# Patient Record
Sex: Male | Born: 1990 | Race: White | Hispanic: No | Marital: Single | Smoking: Never smoker
Health system: Southern US, Community
[De-identification: ages and names within clinical notes are randomized; demographics above are authoritative.]

## PROBLEM LIST (undated history)

## (undated) DIAGNOSIS — S72009A Fracture of unspecified part of neck of unspecified femur, initial encounter for closed fracture: Secondary | ICD-10-CM

## (undated) DIAGNOSIS — G8220 Paraplegia, unspecified: Secondary | ICD-10-CM

## (undated) DIAGNOSIS — S34139A Unspecified injury to sacral spinal cord, initial encounter: Secondary | ICD-10-CM

## (undated) DIAGNOSIS — Q780 Osteogenesis imperfecta: Secondary | ICD-10-CM

## (undated) DIAGNOSIS — M419 Scoliosis, unspecified: Secondary | ICD-10-CM

## (undated) DIAGNOSIS — Z8669 Personal history of other diseases of the nervous system and sense organs: Secondary | ICD-10-CM

## (undated) DIAGNOSIS — S329XXA Fracture of unspecified parts of lumbosacral spine and pelvis, initial encounter for closed fracture: Secondary | ICD-10-CM

## (undated) DIAGNOSIS — Q6601 Congenital talipes equinovarus, right foot: Secondary | ICD-10-CM

## (undated) DIAGNOSIS — G629 Polyneuropathy, unspecified: Secondary | ICD-10-CM

## (undated) HISTORY — PX: APPENDECTOMY: SHX54

## (undated) HISTORY — DX: Osteogenesis imperfecta: Q78.0

## (undated) HISTORY — DX: Unspecified injury to sacral spinal cord, initial encounter: S34.139A

## (undated) HISTORY — DX: Paraplegia, unspecified: G82.20

## (undated) HISTORY — PX: JOINT REPLACEMENT: SHX530

## (undated) HISTORY — PX: OTHER SURGICAL HISTORY: SHX169

## (undated) HISTORY — DX: Fracture of unspecified part of neck of unspecified femur, initial encounter for closed fracture: S72.009A

## (undated) HISTORY — PX: FOREARM FASCIOTOMY: SHX1674

## (undated) HISTORY — DX: Polyneuropathy, unspecified: G62.9

## (undated) HISTORY — DX: Scoliosis, unspecified: M41.9

## (undated) HISTORY — DX: Congenital talipes equinovarus, right foot: Q66.01

## (undated) HISTORY — DX: Fracture of unspecified parts of lumbosacral spine and pelvis, initial encounter for closed fracture: S32.9XXA

## (undated) HISTORY — DX: Personal history of other diseases of the nervous system and sense organs: Z86.69

---
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
## 2009-04-28 ENCOUNTER — Emergency Department: Payer: Self-pay | Admitting: Emergency Medicine

## 2009-04-30 ENCOUNTER — Emergency Department (HOSPITAL_COMMUNITY): Admission: EM | Admit: 2009-04-30 | Discharge: 2009-04-30 | Payer: Self-pay | Admitting: Emergency Medicine

## 2009-05-01 ENCOUNTER — Emergency Department (HOSPITAL_COMMUNITY): Admission: EM | Admit: 2009-05-01 | Discharge: 2009-05-01 | Payer: Self-pay | Admitting: Emergency Medicine

## 2009-11-14 ENCOUNTER — Emergency Department: Payer: Self-pay | Admitting: Emergency Medicine

## 2011-04-14 DIAGNOSIS — Z96641 Presence of right artificial hip joint: Secondary | ICD-10-CM | POA: Insufficient documentation

## 2011-07-29 DIAGNOSIS — S63013A Subluxation of distal radioulnar joint of unspecified wrist, initial encounter: Secondary | ICD-10-CM | POA: Insufficient documentation

## 2011-11-10 DIAGNOSIS — S3282XA Multiple fractures of pelvis without disruption of pelvic ring, initial encounter for closed fracture: Secondary | ICD-10-CM

## 2011-11-10 DIAGNOSIS — S72009A Fracture of unspecified part of neck of unspecified femur, initial encounter for closed fracture: Secondary | ICD-10-CM

## 2011-11-10 HISTORY — DX: Fracture of unspecified part of neck of unspecified femur, initial encounter for closed fracture: S72.009A

## 2011-11-10 HISTORY — DX: Multiple fractures of pelvis without disruption of pelvic ring, initial encounter for closed fracture: S32.82XA

## 2011-11-26 HISTORY — PX: EXCISION RADIAL HEAD: SHX6611

## 2011-11-28 DIAGNOSIS — S3282XA Multiple fractures of pelvis without disruption of pelvic ring, initial encounter for closed fracture: Secondary | ICD-10-CM | POA: Insufficient documentation

## 2011-12-21 DIAGNOSIS — S32599A Other specified fracture of unspecified pubis, initial encounter for closed fracture: Secondary | ICD-10-CM | POA: Insufficient documentation

## 2013-04-21 DIAGNOSIS — Q5310 Unspecified undescended testicle, unilateral: Secondary | ICD-10-CM | POA: Insufficient documentation

## 2013-04-21 DIAGNOSIS — N4889 Other specified disorders of penis: Secondary | ICD-10-CM | POA: Insufficient documentation

## 2013-04-21 DIAGNOSIS — N529 Male erectile dysfunction, unspecified: Secondary | ICD-10-CM | POA: Insufficient documentation

## 2013-08-01 DIAGNOSIS — N201 Calculus of ureter: Secondary | ICD-10-CM | POA: Insufficient documentation

## 2013-11-20 DIAGNOSIS — Z79899 Other long term (current) drug therapy: Secondary | ICD-10-CM | POA: Insufficient documentation

## 2013-11-20 DIAGNOSIS — M545 Low back pain, unspecified: Secondary | ICD-10-CM | POA: Insufficient documentation

## 2013-11-20 DIAGNOSIS — M79602 Pain in left arm: Secondary | ICD-10-CM | POA: Insufficient documentation

## 2013-11-20 DIAGNOSIS — M546 Pain in thoracic spine: Secondary | ICD-10-CM | POA: Insufficient documentation

## 2013-11-20 DIAGNOSIS — M25551 Pain in right hip: Secondary | ICD-10-CM | POA: Insufficient documentation

## 2013-11-20 DIAGNOSIS — M25552 Pain in left hip: Secondary | ICD-10-CM

## 2013-11-20 DIAGNOSIS — T819XXA Unspecified complication of procedure, initial encounter: Secondary | ICD-10-CM | POA: Insufficient documentation

## 2013-11-20 DIAGNOSIS — M961 Postlaminectomy syndrome, not elsewhere classified: Secondary | ICD-10-CM | POA: Insufficient documentation

## 2014-03-01 DIAGNOSIS — M4106 Infantile idiopathic scoliosis, lumbar region: Secondary | ICD-10-CM | POA: Insufficient documentation

## 2014-03-01 DIAGNOSIS — K529 Noninfective gastroenteritis and colitis, unspecified: Secondary | ICD-10-CM | POA: Insufficient documentation

## 2014-03-12 DIAGNOSIS — M87051 Idiopathic aseptic necrosis of right femur: Secondary | ICD-10-CM | POA: Insufficient documentation

## 2014-04-26 DIAGNOSIS — R310 Gross hematuria: Secondary | ICD-10-CM | POA: Insufficient documentation

## 2014-05-29 DIAGNOSIS — Z96641 Presence of right artificial hip joint: Secondary | ICD-10-CM | POA: Insufficient documentation

## 2014-05-29 DIAGNOSIS — M419 Scoliosis, unspecified: Secondary | ICD-10-CM | POA: Insufficient documentation

## 2014-05-29 DIAGNOSIS — Z981 Arthrodesis status: Secondary | ICD-10-CM | POA: Insufficient documentation

## 2014-05-29 DIAGNOSIS — W1789XA Other fall from one level to another, initial encounter: Secondary | ICD-10-CM | POA: Insufficient documentation

## 2014-05-30 DIAGNOSIS — G8220 Paraplegia, unspecified: Secondary | ICD-10-CM

## 2014-05-30 HISTORY — DX: Paraplegia, unspecified: G82.20

## 2014-06-25 DIAGNOSIS — S34139A Unspecified injury to sacral spinal cord, initial encounter: Secondary | ICD-10-CM | POA: Insufficient documentation

## 2014-06-25 HISTORY — DX: Unspecified injury to sacral spinal cord, initial encounter: S34.139A

## 2014-07-24 ENCOUNTER — Ambulatory Visit: Payer: Medicare Other | Attending: Surgical | Admitting: Physical Therapy

## 2014-07-24 DIAGNOSIS — M545 Low back pain, unspecified: Secondary | ICD-10-CM

## 2014-07-24 DIAGNOSIS — X58XXXD Exposure to other specified factors, subsequent encounter: Secondary | ICD-10-CM | POA: Insufficient documentation

## 2014-07-24 DIAGNOSIS — S298XXD Other specified injuries of thorax, subsequent encounter: Secondary | ICD-10-CM | POA: Diagnosis present

## 2014-07-24 DIAGNOSIS — S24104A Unspecified injury at T11-T12 level of thoracic spinal cord, initial encounter: Secondary | ICD-10-CM

## 2014-07-24 NOTE — Therapy (Signed)
[facility] MAIN [facility] SERVICES [address] [place], [place], [address] Phone: [phone]   Fax:  [phone]  Physical Therapy Evaluation  Patient Details  Name: Christian Harmon MRN: [id number] Date of Birth: 26-Feb-1990 Referring Provider:  No ref. provider found  Encounter Date: 07/24/2014      PT End of Session - 07/24/14 1150    Visit Number 1   Number of Visits 9   Date for PT Re-Evaluation 09/04/14   Authorization Type 1/10   PT Start Time 0830   PT Stop Time 0902   PT Time Calculation (min) 32 min   Activity Tolerance Patient tolerated treatment well   Behavior During Therapy Agitated;WFL for tasks assessed/performed      No past medical history on file.  No past surgical history on file.  There were no vitals filed for this visit.  Visit Diagnosis:  Injury at T11-T12 level of thoracic spinal cord, initial encounter  Midline low back pain without sciatica      Subjective Assessment - 07/24/14 0829    Subjective Patient reports he fell and had a T12 injury, and has been paralyzed from the waist down. Patient went to inpatient rehab for roughly 10 days and has been home for roughly one week since then. Patient has a stander at home, and wants to come in for stretching his legs and to decrease his back pain.    Patient is accompained by: Family member   Currently in Pain? Yes   Pain Score 8    Pain Location Leg   Pain Orientation Left;Right   Multiple Pain Sites Yes   Pain Score 8   Pain Location Back   Pain Orientation Lower   Pain Type Chronic pain   Pain Onset More than a month ago   Aggravating Factors  Prolonged sitting, though it waxes and wanes throughout the day.    Effect of Pain on Daily Activities Decreases his tolerance for UE exercise             [facility] PT Assessment - 07/24/14 0900    Assessment   Medical Diagnosis Spinal cord injury T 12   Onset Date/Surgical Date 05/24/14   Prior Therapy --   Inpatient rehab at [facility]    Precautions   Precautions Fall   Restrictions   Weight Bearing Restrictions No   Balance Screen   Has the patient fallen in the past 6 months Yes   How many times? 1   Has the patient had a decrease in activity level because of a fear of falling?  Yes   Is the patient reluctant to leave their home because of a fear of falling?  No   Prior Function   Level of Independence Independent with community mobility with device  Uses wheelchair   Cognition   Overall Cognitive Status Within Functional Limits for tasks assessed   Sensation   Light Touch Impaired Detail   Light Touch Impaired Details Absent LLE;Absent RLE   Posture/Postural Control   Posture/Postural Control Postural limitations   Postural Limitations Forward head;Flexed trunk   Tone   Assessment Location Right Lower Extremity;Left Lower Extremity   Flexibility   Hamstrings --  90-90 test provoked his symptoms   Piriformis Provoked symptoms   Bed Mobility   Right Sidelying to Sit 7: Independent   Supine to Sit 7: Independent   Transfers   Transfers Lateral/Scoot Transfers   Lateral/Scoot Transfers 6: Modified independent (Device/Increase time)  Uses wheelchair   Ambulation/Gait   Ambulation/Gait No   Control and instrumentation engineer Assistance 6: Modified independent (Device/Increase time)   Occupational hygienist Both upper extremities   Wheelchair Parts Management Independent   Distance --  Pharmacologist Sitting - Balance Support Feet unsupported   Static Sitting - Level of Assistance 7: Independent   Static Sitting - Comment/# of Minutes 3   Dynamic Sitting Balance   Dynamic Sitting - Balance Support Feet unsupported   Dynamic Sitting - Level of Assistance 7: Independent   Dynamic Sitting - Balance Activities Trunk control activities   Dynamic Sitting balance - Comments Mild strength deficits with anterior/posterior and lateral  perturbations   RLE Tone   RLE Tone Flaccid   LLE Tone   LLE Tone Flaccid     HEP Provided -- Patient's mother was educated hands on in stretching program  90-90 hamstrings stretch (initially painful, but able to be completed without pain at lower ROM of knee extension) SLR's with PROM, initially painful at higher levels of ROM (in his lower back). Piriformis stretching again initially painful, though this was able to be reduced by decreasing the ROM used.  X 30 seconds for 2 repetitions bilaterally for each of these.  2+ reflex noted in patellar tendon on RLE Patient noted to have spasms in LEs at various points during the session.                       PT Education - 07/24/14 1149    Education provided Yes   Education Details Patient educated on PT plan of care and home exercise program/stretching program.    Person(s) Educated Patient;Parent(s)   Methods Explanation;Demonstration;Handout  Had his mother complete stretches with him   Comprehension Verbalized understanding;Returned demonstration;Verbal cues required             PT Long Term Goals - 07/24/14 1327    PT LONG TERM GOAL #1   Title Patient will be independent with HEP to increase UE and trunkal strength to decrease pain and increase tolerance for wheelchair mobility by7/26/2016.   Status New   PT LONG TERM GOAL #2   Title Patient will sit independently on mat table for at least 5 minutes with no UE or LE support to complete ADLs by 09/04/2014   Status New   PT LONG TERM GOAL #3   Title Patient will report a worst VAS score of 4/10 in his lower back to allow for increased mobility tolerance by 09/04/2014.   Baseline 8/10 pain    Status New   PT LONG TERM GOAL #4   Title Patient will not express pain symptoms in lower back with LE stretching program indicating improved tissue length and mobility to allow for increased mobility by 09/04/2014   Status New               Plan - 07/24/14 1248     Clinical Impression Statement Patient is a 24 y/o male that presents with recent onset SCI at the T12 level. Patient was late to appointment, and thus a complete examination was not possible. He currently has no sensation or motor strength below his waist. He is currently independent with transfers and wheelchair mobility. He displays mild sitting balance deficits and low back pain that is limiting his mobility and participation in therapeutic exercises. Patient was discharged from inpatient rehab roughly 1 week prior to this, and had  not made LE mobility progress as was hoped. Patient would benefit from outpatient PT services to address his trunkal weakness, low back pain, and mild UE strength defiicts via development of a HEP.    Pt will benefit from skilled therapeutic intervention in order to improve on the following deficits Cardiopulmonary status limiting activity;Decreased strength;Pain;Decreased balance   Rehab Potential Good   Clinical Impairments Affecting Rehab Potential Has had a recent SCI, is young and fairly motivated.    PT Frequency 2x / week   PT Duration 4 weeks   PT Treatment/Interventions ADLs/Self Care Home Management;Wheelchair mobility training;Balance training;Therapeutic exercise;Neuromuscular re-education;Therapeutic activities;Manual techniques   PT Next Visit Plan Progress HEP as tolerated and include seated balance/trunkal strengthening activities    PT Home Exercise Plan See patient instructions    Consulted and Agree with Plan of Care Family member/caregiver;Patient   Family Member Consulted Mother           G-Codes - 08/13/2014 1152    Functional Limitation Changing and maintaining body position   Changing and Maintaining Body Position Current Status [phone]) At least 1 percent but less than 20 percent impaired, limited or restricted   Changing and Maintaining Body Position Goal Status (U0454) 0 percent impaired, limited or restricted       Problem  List There are no active problems to display for this patient.  Kerin Ransom, PT, DPT   Aug 13, 2014, 1:34 PM  [facility] MAIN [facility] SERVICES [address] [place], [place], [address] Phone: [phone]   Fax:  [phone]

## 2014-07-24 NOTE — Patient Instructions (Signed)
All exercises provided were adapted from [email or web address]. Patient was provided a written handout with pictures as described. Any additional cues were manually entered in to handout and copied in to this document.    Bicep Curls  Sit with elbows tucked at your sides. Bend your elbow up, bringing the weight up to your shoulder. Slowly bend your elbow back down to your side.     PIRIFORMIS STRETCH - MODIFIED  While lying on your back, hold your knee with your opposite hand and draw your knee up and over towards your opposite shoulder.    Hamstring Stretch with Fascial Glides  Lying on your back and keeping both legs straight, raise leg with strap until you feel a stretch. Next, pump your ankle up and down 20 times, feeling the stretch increase as your toes come towards your head. Lower leg back to table and repeat three times.   Wheelchair Tricep [employer] wheelchair. Perform pressure relief with both hands or forearm using armrest.  Hold position for required frequency and duration. For alternate hand placement, push up from wheels.    FREE WEIGHT - CHEST PRESS  Lie on your back with your elbows bent. Next, slowly raise up your arms towards the ceiling while extending your elbows straight up above your head.    Hamstring Stretch 90-90, Supine  Position the patient lying on his/her back with hip and knee bent. Slowly attempt to straighten the knee while the hip remains in the same position.

## 2014-07-26 ENCOUNTER — Encounter: Payer: Self-pay | Admitting: Physical Therapy

## 2014-07-26 ENCOUNTER — Ambulatory Visit: Payer: Medicare Other | Admitting: Physical Therapy

## 2014-07-26 DIAGNOSIS — S24104A Unspecified injury at T11-T12 level of thoracic spinal cord, initial encounter: Secondary | ICD-10-CM

## 2014-07-26 DIAGNOSIS — M545 Low back pain, unspecified: Secondary | ICD-10-CM

## 2014-07-26 DIAGNOSIS — S298XXD Other specified injuries of thorax, subsequent encounter: Secondary | ICD-10-CM | POA: Diagnosis not present

## 2014-07-26 NOTE — Therapy (Signed)
[facility] MAIN [facility] SERVICES [address] [place], [place], [address] Phone: [phone]   Fax:  [phone]  Physical Therapy Treatment  Patient Details  Name: Christian Harmon MRN: [id number] Date of Birth: 1990-05-13 Referring Provider:  No ref. provider found  Encounter Date: 07/26/2014      PT End of Session - 07/26/14 0852    Visit Number 2   Number of Visits 9   Date for PT Re-Evaluation 09/04/14   Authorization Type 2/10   PT Start Time 0804   PT Stop Time 0844   PT Time Calculation (min) 40 min   Activity Tolerance Patient tolerated treatment well   Behavior During Therapy Agitated;WFL for tasks assessed/performed      No past medical history on file.  No past surgical history on file.  There were no vitals filed for this visit.  Visit Diagnosis:  Injury at T11-T12 level of thoracic spinal cord, initial encounter  Midline low back pain without sciatica      Subjective Assessment - 07/26/14 0805    Subjective Patient reports he felt the stretches were effectivre in loosening up his legs, but increased his back pain dramatically.    Patient is accompained by: Family member   Pertinent History Patient had a spinal cord injury at T12 roughly later March, early April of this past year.    Limitations Sitting   Patient Stated Goals To increase his UE strength and stretch out his legs.    Currently in Pain? Yes   Pain Score 8    Pain Location Back   Pain Orientation Lower   Pain Descriptors / Indicators Aching   Pain Type Chronic pain   Pain Onset More than a month ago   Pain Frequency Intermittent   Aggravating Factors  Stretching, prolonged sitting.    Effect of Pain on Daily Activities Makes it difficult for him to complete wheelchair mobility and therapeutic activities.        Manual therapy  Soft tissue mobilization provided to the lumbar paraspinals and bilateral posterior flank musculature with patient  sitting to relieve tightness and pain. Patient found significant relief.   There-Ex  Push-pull drill with PVC pipe in sitting x 6 repetitions with 5 second holds for 2 sets to increase core strength Bilateral shoulder ER exercise with yellow theraband x 10 for 2 sets with cuing for elbows being maintained at his sides.  Middle rows with yellow theraband x 10 repetitions for 2 sets with cuing for maintaining wrists and elbows at the same height.  Scapular retractions x 10 for 2 sets in sitting to promote more vertical trunk posture  Educated patient to use a towel roll or pillow to provide lumbar support.                           PT Education - 07/26/14 0850    Education provided Yes   Education Details Patient educated patient on upright posture and it's impact on low back pain and shoulder ROM/impingement. Patient educated to decrease his LE stretching by half and to have his parents perform soft tissue mobilizaiton to his low back.    Person(s) Educated Patient;Parent(s)   Methods Demonstration;Explanation;Handout   Comprehension Verbalized understanding;Returned demonstration;Verbal cues required             PT Long Term Goals - 07/24/14 1327    PT LONG TERM GOAL #1   Title Patient will be independent  with HEP to increase UE and trunkal strength to decrease pain and increase tolerance for wheelchair mobility by7/26/2016.   Status New   PT LONG TERM GOAL #2   Title Patient will sit independently on mat table for at least 5 minutes with no UE or LE support to complete ADLs by 09/04/2014   Status New   PT LONG TERM GOAL #3   Title Patient will report a worst VAS score of 4/10 in his lower back to allow for increased mobility tolerance by 09/04/2014.   Baseline 8/10 pain    Status New   PT LONG TERM GOAL #4   Title Patient will not express pain symptoms in lower back with LE stretching program indicating improved tissue length and mobility to allow for  increased mobility by 09/04/2014   Status New               Plan - 07/26/14 0852    Clinical Impression Statement Patient initially presents with increased low back pain, which he attributes to stretching program provided at eval. Patient responded quite well to trunk strengthening and soft tissue mobilization provided today and stated he felt an improvement in low back symptoms after the session. Patient provided HEP including core and rotator cuff strengthening activities to reduce likelihood of shoulder injury and reduce low back pain symtpoms. Skilled acute PT services are indicated to address his low back pain, mobility, and reduce likelihood of shoulder injury.   Pt will benefit from skilled therapeutic intervention in order to improve on the following deficits Cardiopulmonary status limiting activity;Decreased strength;Pain;Decreased balance;Decreased endurance;Improper body mechanics   Rehab Potential Good   Clinical Impairments Affecting Rehab Potential History of spinal cord injury at T12 level.    PT Frequency 2x / week   PT Duration 4 weeks   PT Treatment/Interventions ADLs/Self Care Home Management;Wheelchair mobility training;Balance training;Therapeutic exercise;Neuromuscular re-education;Therapeutic activities;Manual techniques   PT Next Visit Plan Progress periscapular strengthening, core strengthening and manual techniques as indicated by pain.    PT Home Exercise Plan See patient instructions    Consulted and Agree with Plan of Care Family member/caregiver;Patient   Family Member Consulted Father         Problem List There are no active problems to display for this patient.  Kerin Ransom, PT, DPT   07/26/2014, 12:03 PM  [facility] MAIN [facility] SERVICES [address] [place], [place], [address] Phone: [phone]   Fax:  [phone]

## 2014-07-26 NOTE — Patient Instructions (Signed)
All exercises provided were adapted from [email or web address]. Patient was provided a written handout with pictures as described. Any additional cues were manually entered in to handout and copied in to this document.    Bilateral Shoulder External Rotation (10 times, 3 second hold, 1 set, 1 time per day)  Standing or sitting with upright posture, hold the middle of a piece of theraband with both hands. With your elbows at your side and bent at 90 degree angle, pull your hands outward and hold for 3 seconds. You should feel you shoulders roll backward and your chest move forward.    ** Make sure to pinch your shoulder blades together throughout.    Retraction (10 times, 3 second hold, 2 sets, 1 time per day)  Push arms straight forward and pull back to squeeze shoulder blades together.     Use a PVC pipe or broom handle to push and pull against resistance   SCAPULAR RETRACTIONS (10 times, 3 second hold, 2 sets, 2 time per day)  Draw your shoulder blades back and down.    3 WAY SCAPULAR ROW - MIDDLE (10 times, 3 second hold, 2 sets, 1 time per day)  Tie a knot in the elastic band and close the knot in the door jam. Perform this exercise with the elastic band at elbow level.  Hold an elastic band with both arms in front of you. Next, pull the band back towards your side as you bend your elbows.

## 2014-07-31 ENCOUNTER — Ambulatory Visit: Payer: Medicare Other | Admitting: Physical Therapy

## 2014-07-31 DIAGNOSIS — S24104A Unspecified injury at T11-T12 level of thoracic spinal cord, initial encounter: Secondary | ICD-10-CM

## 2014-07-31 DIAGNOSIS — S298XXD Other specified injuries of thorax, subsequent encounter: Secondary | ICD-10-CM | POA: Diagnosis not present

## 2014-07-31 DIAGNOSIS — M545 Low back pain, unspecified: Secondary | ICD-10-CM

## 2014-07-31 NOTE — Therapy (Signed)
[place] MAIN [facility] SERVICES [address] [place], [place], [address] Phone: [phone]   Fax:  [phone]  Physical Therapy Treatment  Patient Details  Name: Christian Harmon MRN: [id number] Date of Birth: 1990-12-10 Referring Provider:  No ref. provider found  Encounter Date: 07/31/2014      PT End of Session - 07/31/14 1047    Visit Number 3   Number of Visits 9   Date for PT Re-Evaluation 09/04/14   Authorization Type 3/10   PT Start Time 0901   PT Stop Time 0944   PT Time Calculation (min) 43 min   Activity Tolerance Patient tolerated treatment well   Behavior During Therapy Agitated;WFL for tasks assessed/performed      No past medical history on file.  No past surgical history on file.  There were no vitals filed for this visit.  Visit Diagnosis:  Injury at T11-T12 level of thoracic spinal cord, initial encounter  Midline low back pain without sciatica      Subjective Assessment - 07/31/14 0904    Subjective Patient reports that he has started having numbness and tingling in his RUE (middle, ring, and pinky fingers), that is alleviated when his mom "presses on the nerve". The family associates it with poor positioning in the w/c.    Patient is accompained by: Family member   Pertinent History Patient had a spinal cord injury at T12 roughly later March, early April of this past year.    Limitations Sitting   Patient Stated Goals To increase his UE strength and stretch out his legs.    Currently in Pain? Yes   Pain Score 7    Pain Location Back   Pain Orientation Lower   Pain Descriptors / Indicators Aching   Pain Type Chronic pain   Pain Onset More than a month ago   Pain Frequency Intermittent   Aggravating Factors  Prolonged sitting       There-Ex  Negative Vertebral artery bilaterally  Full AROM in all directions of the neck. Mild increase in symptoms with right ipsilateral side bending.  Negative  Spurling's in neutral, extension, rotation, and extension with rotation bilaterally  Attempted self finger extensor/wrist extensor stretching x 1 minute, no relief of symptoms.  Push-pull with PVC pipe x 10 repetitions for 2 sets with no increased LBP Prone push-up to elbows, unable to achieve any relief in LBP secondary to decreased ROM in lumbar spine  Yellow theraband bilateral external rotations x 12 for 2 sets. Proper technique encouraged (elbows at sides).    Manual therapy  Soft tissue mobilization on lumbar paraspinals   ULTT for ulnar nerve repeated exercise x 1 minute for 3 repetitions, reduced numbness and tingling by 50%  ** Patient and father inquired about TENS unit and heat to relieve tightness/pain in lumbar spine.  ** When patient left session he stated very minimal numbness and tingling just in the tips of middle, ring, and pinky finger on RUE and minimal to no pain in lower back.                          PT Education - 07/31/14 1045    Education provided Yes   Education Details Patient educated to flex trunk forward when he feels stiff/painful. Patient educated on upper limb tension test position to relieve numbness/tingling in RUE.    Person(s) Educated Patient;Parent(s)   Methods Explanation;Demonstration;Handout   Comprehension Verbalized understanding;Returned demonstration;Verbal cues  required             PT Long Term Goals - 07/31/14 1052    PT LONG TERM GOAL #1   Title Patient will be independent with HEP to increase UE and trunkal strength to decrease pain and increase tolerance for wheelchair mobility by7/26/2016.   Status Partially Met   PT LONG TERM GOAL #2   Title Patient will sit independently on mat table for at least 5 minutes with no UE or LE support to complete ADLs by 09/04/2014   Status Achieved   PT LONG TERM GOAL #3   Title Patient will report a worst VAS score of 4/10 in his lower back to allow for increased mobility  tolerance by 09/04/2014.   Baseline 8/10 pain    Status On-going   PT LONG TERM GOAL #4   Title Patient will not express pain symptoms in lower back with LE stretching program indicating improved tissue length and mobility to allow for increased mobility by 09/04/2014   Status On-going               Plan - 07/31/14 1047    Clinical Impression Statement Patient initially presents with low back pain and numbness/tingling in his RUE (middle, ring, and pinky fingers). He reports a significant decrease in numbness/tingling with upper limb tension testing (ulnar nerve position) and did not show any cervical involvement via testing. Patient reports resolution of back pain after soft tissue mobilization and forward trunk flexion. Patient would benefit from contuned shoulder girdle and core strengthening to reduce his symptoms and increase his UE strength to minimize risk of rotator cuff pathology.    Pt will benefit from skilled therapeutic intervention in order to improve on the following deficits Cardiopulmonary status limiting activity;Decreased strength;Pain;Decreased balance;Decreased endurance;Improper body mechanics   Rehab Potential Good   Clinical Impairments Affecting Rehab Potential History of spinal cord injury at T12 level.    PT Frequency 2x / week   PT Duration 4 weeks   PT Treatment/Interventions ADLs/Self Care Home Management;Wheelchair mobility training;Balance training;Therapeutic exercise;Neuromuscular re-education;Therapeutic activities;Manual techniques   PT Next Visit Plan Progress self management techniques for LBP (soft tissue mobilization, forward trunk flexion), increase core strengthening and shoulder girdle strengthening as tolerated.    PT Home Exercise Plan See patient instructions    Consulted and Agree with Plan of Care Family member/caregiver;Patient   Family Member Consulted Father         Problem List There are no active problems to display for this  patient.    Kerman Passey, PT, DPT   07/31/2014, 10:54 AM  [place] MAIN [facility] SERVICES [address] [place], [place], [address] Phone: [phone]   Fax:  [phone]

## 2014-07-31 NOTE — Patient Instructions (Addendum)
All exercises provided were adapted from [email or web address]. Patient was provided a written handout with pictures as described. Any additional cues were manually entered in to handout and copied in to this document.    ULNAR NERVE FLOSS - B (10 times, 3 second hold, 1 set, 2 x per day)  Start with your arm up and out to the side with a bend elbow as shown. Your palm should be facing towards the side. Next, bend your wrist towards you as you side bend your head towards the target arm as shown. Then, bend your wrist away from you as you side bend your head away from the target arm.   Your other hand should be checking to make sure that your shoulder stays down and drawn back the entire time.     Anterior/Med Scalene Stretch (10 times, 5 second hold, 1 set, 2 x per day)  Pull your head to the side with one arm while the other arm stays in a relaxed position by your side.  Next look towards the ceiling while you are continuing to pull with one hand.  Finally, apply a chin tuck to the final position for the final stretch.

## 2014-08-02 ENCOUNTER — Ambulatory Visit: Payer: Medicare Other | Admitting: Physical Therapy

## 2014-08-02 DIAGNOSIS — S24104A Unspecified injury at T11-T12 level of thoracic spinal cord, initial encounter: Secondary | ICD-10-CM

## 2014-08-02 DIAGNOSIS — M545 Low back pain, unspecified: Secondary | ICD-10-CM

## 2014-08-02 DIAGNOSIS — S298XXD Other specified injuries of thorax, subsequent encounter: Secondary | ICD-10-CM | POA: Diagnosis not present

## 2014-08-02 NOTE — Therapy (Signed)
[place] MAIN [facility] SERVICES [address] [place], [place], [address] Phone: [phone]   Fax:  [phone]  Physical Therapy Treatment  Patient Details  Name: Terryon Pineiro MRN: [id number] Date of Birth: Nov 18, 1990 Referring Provider:  No ref. provider found  Encounter Date: 08/02/2014      PT End of Session - 08/02/14 0900    Visit Number 4   Number of Visits 9   Date for PT Re-Evaluation 09/04/14   Authorization Type 4/10   PT Start Time 0811   PT Stop Time 0853   PT Time Calculation (min) 42 min   Activity Tolerance Patient tolerated treatment well   Behavior During Therapy Agitated;WFL for tasks assessed/performed      No past medical history on file.  No past surgical history on file.  There were no vitals filed for this visit.  Visit Diagnosis:  Injury at T11-T12 level of thoracic spinal cord, initial encounter  Midline low back pain without sciatica      Subjective Assessment - 08/02/14 0826    Subjective Patient reports he is noticing significant pain relief in his lower back with a change in medications and with the soft tissue mobilizations. Patient continues to have numbness and tingling in his RUE, he found some relief from stretching at home but not like in the clinic.    Pertinent History Patient had a spinal cord injury at T12 roughly later March, early April of this past year.    Limitations Sitting   Patient Stated Goals To increase his UE strength and stretch out his legs.    Pain Score 6    Pain Location Back   Pain Orientation Lower   Pain Descriptors / Indicators Aching   Pain Type Chronic pain   Pain Onset More than a month ago   Pain Frequency Intermittent   Aggravating Factors  Prolonged sitting    Effect of Pain on Daily Activities Makes it difficult to complete w/c exercises and therapeutic activities.    Multiple Pain Sites No       Manual Therapy  Soft tissue mobilization provided to  lower back, relieved tension and tightness in the lower back  ULTT stretching for ulnar nerve x 5 repetitions with 30 second holds, significantly relieved numbness/tingling in fingers  Manual cervical traction x 30 seconds for 3 sets. Relieved vast majority of numbness/tingling in fingers    There-Ex  Scaption with 3# DBs x 10 repetitions for 2 sets  Chest press with scapular punch 7# DB x 10 repetitions for 2 sets                           PT Education - 08/02/14 0859    Education provided Yes   Education Details Patient educated on progression with pain, to continue HEP and soft tissue mobilization, progressed HEP to include pec minor stretching and instructed patient to complete cervical traction with parents.    Person(s) Educated Patient   Methods Explanation;Demonstration;Handout   Comprehension Verbalized understanding;Returned demonstration;Verbal cues required             PT Long Term Goals - 07/31/14 1052    PT LONG TERM GOAL #1   Title Patient will be independent with HEP to increase UE and trunkal strength to decrease pain and increase tolerance for wheelchair mobility by7/26/2016.   Status Partially Met   PT LONG TERM GOAL #2   Title Patient will sit independently  on mat table for at least 5 minutes with no UE or LE support to complete ADLs by 09/04/2014   Status Achieved   PT LONG TERM GOAL #3   Title Patient will report a worst VAS score of 4/10 in his lower back to allow for increased mobility tolerance by 09/04/2014.   Baseline 8/10 pain    Status On-going   PT LONG TERM GOAL #4   Title Patient will not express pain symptoms in lower back with LE stretching program indicating improved tissue length and mobility to allow for increased mobility by 09/04/2014   Status On-going               Plan - 08/02/14 0902    Clinical Impression Statement Patient continues to make progress with lower back pain and numbness/tingling in his RUE with  soft tissue mobilization and manual stretching. Patient is making progress with UE strengthening program, but will need to be re-assessed in next session to ensure that numbness/tingling is not increased with strengthening program. Skilled PT services are indicated to reduce his lower back pain and increase his UE strengthening to help reduce his risk for RTC injuries.    Pt will benefit from skilled therapeutic intervention in order to improve on the following deficits Cardiopulmonary status limiting activity;Decreased strength;Pain;Decreased balance;Decreased endurance;Improper body mechanics   Rehab Potential Good   Clinical Impairments Affecting Rehab Potential History of spinal cord injury at T12 level.    PT Frequency 2x / week   PT Duration 4 weeks   PT Treatment/Interventions ADLs/Self Care Home Management;Wheelchair mobility training;Balance training;Therapeutic exercise;Neuromuscular re-education;Therapeutic activities;Manual techniques   PT Next Visit Plan Progress shoulder girdle strengthening and increase core strengthening as tolerated. Provide manual techniques (soft tissue mobilization, cervical traction, ULTT stretching to relieve pain symptoms).   PT Home Exercise Plan See patient instructions    Consulted and Agree with Plan of Care Patient        Problem List There are no active problems to display for this patient.   Kerman Passey, PT, DPT   08/02/2014, 9:19 AM  [place] MAIN [facility] SERVICES [address] [place], [place], [address] Phone: [phone]   Fax:  [phone]

## 2014-08-02 NOTE — Patient Instructions (Addendum)
All exercises provided were adapted from [email or web address]. Patient was provided a written handout with pictures as described. Any additional cues were manually entered in to handout and copied in to this document.   FREE WEIGHT SCAPTION (10 times, 2 sets, 1x per day)  Slowly raise up your arm away from your side in a forward/lateral direction. Your elbows should be straight and movement to occur in the plane of the scapula or 45 degrees to the side.    ** You used 3#DB for this    FREE WEIGHT - CHEST PRESS (10 times, 2 sets, 1x per day)  Lie on your back with your elbows bent. Next, slowly raise up your arms towards the ceiling while extending your elbows straight up above your head.    ** You used 7# DB for this    Pectoralis Minor Stretch (8 times, 5 second hold, 1 set, 2x per day)  Standing, place shoulder and arm against wall. Step same leg forward. Lean and turn in opposite direction from wall.

## 2014-08-07 ENCOUNTER — Ambulatory Visit: Payer: Medicare Other | Admitting: Physical Therapy

## 2014-08-07 ENCOUNTER — Encounter: Payer: Self-pay | Admitting: Physical Therapy

## 2014-08-07 DIAGNOSIS — S24104A Unspecified injury at T11-T12 level of thoracic spinal cord, initial encounter: Secondary | ICD-10-CM

## 2014-08-07 DIAGNOSIS — M545 Low back pain, unspecified: Secondary | ICD-10-CM

## 2014-08-07 DIAGNOSIS — S298XXD Other specified injuries of thorax, subsequent encounter: Secondary | ICD-10-CM | POA: Diagnosis not present

## 2014-08-07 NOTE — Therapy (Signed)
[place] MAIN [facility] SERVICES [address] [place], [place], [address] Phone: [phone]   Fax:  [phone]  Physical Therapy Treatment  Patient Details  Name: Christian Harmon MRN: [id number] Date of Birth: 06-19-1990 Referring Provider:  No ref. provider found  Encounter Date: 08/07/2014      PT End of Session - 08/07/14 0904    Visit Number 5   Number of Visits 10   Date for PT Re-Evaluation 09/04/14   Authorization Type 5/10   PT Start Time 0835   PT Stop Time 0915   PT Time Calculation (min) 40 min   Activity Tolerance Patient tolerated treatment well   Behavior During Therapy Agitated;WFL for tasks assessed/performed      History reviewed. No pertinent past medical history.  History reviewed. No pertinent past surgical history.  There were no vitals filed for this visit.  Visit Diagnosis:  Injury at T11-T12 level of thoracic spinal cord, initial encounter  Midline low back pain without sciatica      Subjective Assessment - 08/07/14 0845    Subjective (p) Patient says that the tingling and numbness goes away temporally but then it comes back. He has tingling and numbness that started  He reports that he is having back pain today 9/10.          Manual Therapy  Soft tissue mobilization provided to lower back, relieved tension and tightness in the lower back  ULTT stretching for ulnar nerve x 5 repetitions with 30 second holds, significantly relieved numbness/tingling in fingers  E-stim crossed pattern to Right paraspinal muscles x 20 mintues Patient has decreased pain from 9/10 to 7/10 following manual therapy and e-stim.                         PT Education - 08/07/14 [phone]    Education provided Yes   Education Details HEP with self tissue massage.   Person(s) Educated Patient   Methods Explanation;Demonstration   Comprehension Verbalized understanding;Returned demonstration              PT Long Term Goals - 07/31/14 1052    PT LONG TERM GOAL #1   Title Patient will be independent with HEP to increase UE and trunkal strength to decrease pain and increase tolerance for wheelchair mobility by7/26/2016.   Status Partially Met   PT LONG TERM GOAL #2   Title Patient will sit independently on mat table for at least 5 minutes with no UE or LE support to complete ADLs by 09/04/2014   Status Achieved   PT LONG TERM GOAL #3   Title Patient will report a worst VAS score of 4/10 in his lower back to allow for increased mobility tolerance by 09/04/2014.   Baseline 8/10 pain    Status On-going   PT LONG TERM GOAL #4   Title Patient will not express pain symptoms in lower back with LE stretching program indicating improved tissue length and mobility to allow for increased mobility by 09/04/2014   Status On-going               Plan - 08/07/14 0905    Clinical Impression Statement Pateint continues to have mid back pain 9/10 and numbness and tingling in RUE 3rd, 4th and 5 th digit. Patient will continue to benefit from skilled PT to improve pain and mobility.    Pt will benefit from skilled therapeutic intervention in order to improve on the following  deficits Cardiopulmonary status limiting activity;Decreased strength;Pain;Decreased balance;Decreased endurance;Improper body mechanics   PT Frequency 2x / week   PT Duration 4 weeks   PT Treatment/Interventions Cryotherapy;Electrical Stimulation;Moist Heat;Ultrasound;Therapeutic activities;Therapeutic exercise;Dry needling;Manual techniques   PT Next Visit Plan Progress shoulder girdle strengthening and increase core strengthening as tolerated. Provide manual techniques (soft tissue mobilization, cervical traction, ULTT stretching to relieve pain symptoms).   Consulted and Agree with Plan of Care Patient        Problem List There are no active problems to display for this patient.   Alanson Puls 08/07/2014, 9:23  AM  [place] MAIN [facility] SERVICES [address] [place], [place], [address] Phone: [phone]   Fax:  [phone]

## 2014-08-09 ENCOUNTER — Ambulatory Visit: Payer: Medicare Other | Admitting: Physical Therapy

## 2014-08-09 ENCOUNTER — Encounter: Payer: Self-pay | Admitting: Physical Therapy

## 2014-08-09 DIAGNOSIS — M545 Low back pain, unspecified: Secondary | ICD-10-CM

## 2014-08-09 DIAGNOSIS — S24104A Unspecified injury at T11-T12 level of thoracic spinal cord, initial encounter: Secondary | ICD-10-CM

## 2014-08-09 DIAGNOSIS — S298XXD Other specified injuries of thorax, subsequent encounter: Secondary | ICD-10-CM | POA: Diagnosis not present

## 2014-08-09 NOTE — Therapy (Signed)
[place] MAIN [facility] SERVICES [address] [place], [place], [address] Phone: [phone]   Fax:  [phone]  Physical Therapy Treatment  Patient Details  Name: Christian Harmon MRN: [id number] Date of Birth: 1990-05-04 Referring Provider:  No ref. provider found  Encounter Date: 08/09/2014      PT End of Session - 08/09/14 0947    Visit Number 6   Number of Visits 10   Date for PT Re-Evaluation 09/04/14   Authorization Type 5/10   PT Start Time 0830   PT Stop Time 0915   PT Time Calculation (min) 45 min   Activity Tolerance Patient tolerated treatment well   Behavior During Therapy [facility] for tasks assessed/performed      History reviewed. No pertinent past medical history.  History reviewed. No pertinent past surgical history.  There were no vitals filed for this visit.  Visit Diagnosis:  Injury at T11-T12 level of thoracic spinal cord, initial encounter  Midline low back pain without sciatica      Subjective Assessment - 08/09/14 0946    Subjective Patient is having 9/10 pain in right thoracic spine..    Limitations Sitting   Patient Stated Goals To increase his UE strength and stretch out his legs.           Patient seen for manual therapy including the roller stick and the edge tool to paraspinal muscles T 4- T8 on right side. E-stim IFC crossed pattern to throacic paraspinal muscles x 20 minutes. Rib mobilization grade 3 rib 5,6,7 on right side.  Patient reports that pain decreases from 9/10 to 7/10.                             PT Long Term Goals - 07/31/14 1052    PT LONG TERM GOAL #1   Title Patient will be independent with HEP to increase UE and trunkal strength to decrease pain and increase tolerance for wheelchair mobility by7/26/2016.   Status Partially Met   PT LONG TERM GOAL #2   Title Patient will sit independently on mat table for at least 5 minutes with no UE or LE support to  complete ADLs by 09/04/2014   Status Achieved   PT LONG TERM GOAL #3   Title Patient will report a worst VAS score of 4/10 in his lower back to allow for increased mobility tolerance by 09/04/2014.   Baseline 8/10 pain    Status On-going   PT LONG TERM GOAL #4   Title Patient will not express pain symptoms in lower back with LE stretching program indicating improved tissue length and mobility to allow for increased mobility by 09/04/2014   Status On-going               Plan - 08/09/14 0948    Clinical Impression Statement Patient responds to [facility] of thoracic spine and rib mobilixation followed by e-stim IFC to thoracic spine. He will benefit from continued skilled PT to improve pain and mobility.    Pt will benefit from skilled therapeutic intervention in order to improve on the following deficits Cardiopulmonary status limiting activity;Decreased strength;Pain;Decreased balance;Decreased endurance;Improper body mechanics   Rehab Potential Good   Clinical Impairments Affecting Rehab Potential History of spinal cord injury at T12 level.    PT Frequency 2x / week   PT Treatment/Interventions Cryotherapy;Electrical Stimulation;Moist Heat;Ultrasound;Therapeutic activities;Therapeutic exercise;Dry needling;Manual techniques   PT Next Visit Plan Progress shoulder girdle strengthening  and increase core strengthening as tolerated. Provide manual techniques (soft tissue mobilization, cervical traction, ULTT stretching to relieve pain symptoms).   PT Home Exercise Plan See patient instructions    Consulted and Agree with Plan of Care Patient        Problem List There are no active problems to display for this patient.   Alanson Puls 08/09/2014, 9:56 AM  [place] MAIN [facility] SERVICES [address] [place], [place], [address] Phone: [phone]   Fax:  [phone]

## 2014-08-14 ENCOUNTER — Encounter: Payer: Self-pay | Admitting: Physical Therapy

## 2014-08-14 ENCOUNTER — Ambulatory Visit: Payer: Medicare Other | Attending: Surgical | Admitting: Physical Therapy

## 2014-08-14 DIAGNOSIS — M545 Low back pain, unspecified: Secondary | ICD-10-CM

## 2014-08-14 DIAGNOSIS — S298XXD Other specified injuries of thorax, subsequent encounter: Secondary | ICD-10-CM | POA: Diagnosis present

## 2014-08-14 DIAGNOSIS — X58XXXD Exposure to other specified factors, subsequent encounter: Secondary | ICD-10-CM | POA: Insufficient documentation

## 2014-08-14 DIAGNOSIS — S24104A Unspecified injury at T11-T12 level of thoracic spinal cord, initial encounter: Secondary | ICD-10-CM

## 2014-08-14 NOTE — Therapy (Signed)
[place] MAIN [facility] SERVICES [address] [place], [place], [address] Phone: [phone]   Fax:  [phone]  Physical Therapy Treatment  Patient Details  Name: Christian Harmon MRN: [id number] Date of Birth: [birth year] Referring Provider:  Kerney Elbe, [place]*  Encounter Date: 08/14/2014      PT End of Session - 08/14/14 1146    Visit Number 7   Number of Visits 10   Date for PT Re-Evaluation 09/04/14   Authorization Type 5/10   PT Start Time 1120   PT Stop Time 1200   PT Time Calculation (min) 40 min   Activity Tolerance Patient tolerated treatment well   Behavior During Therapy [facility] for tasks assessed/performed      History reviewed. No pertinent past medical history.  History reviewed. No pertinent past surgical history.  There were no vitals filed for this visit.  Visit Diagnosis:  Injury at T11-T12 level of thoracic spinal cord, initial encounter  Midline low back pain without sciatica      Subjective Assessment - 08/14/14 1145    Subjective Patient is having 9810 pain in right thoracic spine..    Patient is accompained by: Family member   Pertinent History Patient had a spinal cord injury at T12 roughly later March, early April of this past year.    Limitations Sitting   Patient Stated Goals To increase his UE strength and stretch out his legs.    Pain Onset More than a month ago   Pain Onset More than a month ago      Patient seen for manual therapy including  the edge tool to paraspinal muscles T 4- T8 on right side. E-stim IFC crossed pattern to throacic paraspinal muscles x 20 minutes. Patient reports that pain decreases from 8/10 to 6/10.  He reports that his pain did not get back up to a 9/10 from the last visit.                           PT Education - 08/14/14 1146    Education provided Yes   Education Details HEp with ice and tissue massage   Person(s) Educated Patient   Methods Explanation   Comprehension Verbalized understanding             PT Long Term Goals - 07/31/14 1052    PT LONG TERM GOAL #1   Title Patient will be independent with HEP to increase UE and trunkal strength to decrease pain and increase tolerance for wheelchair mobility by7/26/2016.   Status Partially Met   PT LONG TERM GOAL #2   Title Patient will sit independently on mat table for at least 5 minutes with no UE or LE support to complete ADLs by 09/04/2014   Status Achieved   PT LONG TERM GOAL #3   Title Patient will report a worst VAS score of 4/10 in his lower back to allow for increased mobility tolerance by 09/04/2014.   Baseline 8/10 pain    Status On-going   PT LONG TERM GOAL #4   Title Patient will not express pain symptoms in lower back with LE stretching program indicating improved tissue length and mobility to allow for increased mobility by 09/04/2014   Status On-going               Plan - 08/14/14 1147    Clinical Impression Statement Patient continues to have high pain level to thoracic spine and responds  to [facility] and e-stim to reduce pain. Discussed changing the side he transfers to into his bed at home to allow the left paraspinal muscles to get some relef.    Pt will benefit from skilled therapeutic intervention in order to improve on the following deficits Cardiopulmonary status limiting activity;Decreased strength;Pain;Decreased balance;Decreased endurance;Improper body mechanics   Rehab Potential Good   Clinical Impairments Affecting Rehab Potential History of spinal cord injury at T12 level.    PT Frequency 2x / week   PT Treatment/Interventions Cryotherapy;Electrical Stimulation;Moist Heat;Ultrasound;Therapeutic activities;Therapeutic exercise;Dry needling;Manual techniques   PT Next Visit Plan Progress shoulder girdle strengthening and increase core strengthening as tolerated. Provide manual techniques (soft tissue mobilization, cervical traction, ULTT  stretching to relieve pain symptoms).   PT Home Exercise Plan See patient instructions    Consulted and Agree with Plan of Care Patient        Problem List There are no active problems to display for this patient.   Alanson Puls 08/14/2014, 11:49 AM  [place] MAIN [facility] SERVICES [address] [place], [place], [address] Phone: [phone]   Fax:  [phone]

## 2014-08-16 ENCOUNTER — Ambulatory Visit: Payer: Medicare Other | Admitting: Physical Therapy

## 2014-08-21 ENCOUNTER — Ambulatory Visit: Payer: Medicare Other | Admitting: Physical Therapy

## 2014-08-21 ENCOUNTER — Encounter: Payer: Self-pay | Admitting: Physical Therapy

## 2014-08-21 DIAGNOSIS — S298XXD Other specified injuries of thorax, subsequent encounter: Secondary | ICD-10-CM | POA: Diagnosis not present

## 2014-08-21 DIAGNOSIS — M545 Low back pain, unspecified: Secondary | ICD-10-CM

## 2014-08-21 DIAGNOSIS — S24104A Unspecified injury at T11-T12 level of thoracic spinal cord, initial encounter: Secondary | ICD-10-CM

## 2014-08-21 NOTE — Therapy (Signed)
[place] MAIN [facility] SERVICES [address] [place], [place], [address] Phone: [phone]   Fax:  [phone]  Physical Therapy Treatment  Patient Details  Name: Christian Harmon MRN: [id number] Date of Birth: [birth year] Referring Provider:  Kerney Elbe, [place]*  Encounter Date: 08/21/2014      PT End of Session - 08/21/14 1152    Visit Number 8   Number of Visits 10   Date for PT Re-Evaluation 09/04/14   Authorization Type 5/10   PT Start Time 1115   PT Stop Time 1200   PT Time Calculation (min) 45 min   Activity Tolerance Patient tolerated treatment well   Behavior During Therapy [facility] for tasks assessed/performed      History reviewed. No pertinent past medical history.  History reviewed. No pertinent past surgical history.  There were no vitals filed for this visit.  Visit Diagnosis:  Injury at T11-T12 level of thoracic spinal cord, initial encounter  Midline low back pain without sciatica      Subjective Assessment - 08/21/14 1203    Subjective Patient reports high level of pain today in thoracic spine. He  reports pain relief following therapy of manual STM , [place] and e-stim. Reviewed stretching with patient and father for LE's. Patinet has no ROM limitations and is not tight in LE's   Patient is accompained by: Family member   Pertinent History Patient had a spinal cord injury at T12 roughly later March, early April of this past year.    Patient Stated Goals To increase his UE strength and stretch out his legs.    Currently in Pain? Yes          Patient seen for manual therapy  to paraspinal muscles T 4- T8 on right side. E-stim IFC crossed pattern to throacic paraspinal muscles x 20 minutes, [place] to right thoracic musculature x 1.5 cm squared x 10 mintues Rib mobilization grade 3 rib 5,6,7 on right side.  Patient reports that pain decreases following therapy. Patient is independent in all mobility  from wc level  And  transfers independently from floor to wc, wc to bed, bed to wc, car to wc.                         PT Education - 08/21/14 1209    Education provided Yes   Education Details Reviewed LE stretching    Person(s) Educated Patient   Methods Explanation   Comprehension Verbalized understanding             PT Long Term Goals - 07/31/14 1052    PT LONG TERM GOAL #1   Title Patient will be independent with HEP to increase UE and trunkal strength to decrease pain and increase tolerance for wheelchair mobility by7/26/2016.   Status Partially Met   PT LONG TERM GOAL #2   Title Patient will sit independently on mat table for at least 5 minutes with no UE or LE support to complete ADLs by 09/04/2014   Status Achieved   PT LONG TERM GOAL #3   Title Patient will report a worst VAS score of 4/10 in his lower back to allow for increased mobility tolerance by 09/04/2014.   Baseline 8/10 pain    Status On-going   PT LONG TERM GOAL #4   Title Patient will not express pain symptoms in lower back with LE stretching program indicating improved tissue length and mobility to allow for increased  mobility by 09/04/2014   Status On-going               Plan - 08/21/14 1210    Clinical Impression Statement Patient continues to have high level of pain to  right thoracic spine. Brusing is gone, skin is normal , spasm is present right thoracic spine. Patient repsonds to treatment with decreased pain .   Pt will benefit from skilled therapeutic intervention in order to improve on the following deficits Cardiopulmonary status limiting activity;Decreased strength;Pain;Decreased balance;Decreased endurance;Improper body mechanics   Rehab Potential Good   Clinical Impairments Affecting Rehab Potential History of spinal cord injury at T12 level.    PT Frequency 2x / week   PT Duration 4 weeks   PT Treatment/Interventions Cryotherapy;Electrical Stimulation;Moist Heat;Ultrasound;Therapeutic  activities;Therapeutic exercise;Dry needling;Manual techniques   PT Next Visit Plan Progress shoulder girdle strengthening and increase core strengthening as tolerated. Provide manual techniques (soft tissue mobilization, cervical traction, ULTT stretching to relieve pain symptoms).   PT Home Exercise Plan See patient instructions    Consulted and Agree with Plan of Care Patient        Problem List There are no active problems to display for this patient.   Alanson Puls 08/21/2014, 12:15 PM  [place] MAIN [facility] SERVICES [address] [place], [place], [address] Phone: [phone]   Fax:  [phone]

## 2014-08-23 ENCOUNTER — Ambulatory Visit: Payer: Medicare Other | Admitting: Physical Therapy

## 2014-08-23 ENCOUNTER — Encounter: Payer: Self-pay | Admitting: Physical Therapy

## 2014-08-23 DIAGNOSIS — M545 Low back pain, unspecified: Secondary | ICD-10-CM

## 2014-08-23 DIAGNOSIS — S298XXD Other specified injuries of thorax, subsequent encounter: Secondary | ICD-10-CM | POA: Diagnosis not present

## 2014-08-23 DIAGNOSIS — S24104A Unspecified injury at T11-T12 level of thoracic spinal cord, initial encounter: Secondary | ICD-10-CM

## 2014-08-23 NOTE — Therapy (Signed)
[place] MAIN [facility] SERVICES [address] [place], [place], [address] Phone: [phone]   Fax:  [phone]  Physical Therapy Treatment  Patient Details  Name: Christian Harmon MRN: [id number] Date of Birth: 19-Aug-1990 Referring Provider:  Kerney Elbe, [place]*  Encounter Date: 08/23/2014      PT End of Session - 08/23/14 1134    Visit Number 9   Number of Visits 10   Date for PT Re-Evaluation 09/04/14   Authorization Type 5/10   PT Start Time 1110   Activity Tolerance Patient limited by pain   Behavior During Therapy [facility] for tasks assessed/performed      History reviewed. No pertinent past medical history.  History reviewed. No pertinent past surgical history.  There were no vitals filed for this visit.  Visit Diagnosis:  Injury at T11-T12 level of thoracic spinal cord, initial encounter  Midline low back pain without sciatica      Subjective Assessment - 08/23/14 1118    Subjective Patient reports high level of pain today 8/10 in thoracic spine. He  reports pain relief following therapy heat, [place] and e-stim. Patinet has no ROM limitations and is not tight in LE's. 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    Patient is accompained by: Family member   Pertinent History Patient had a spinal cord injury at T12 roughly later March, early April of this past year.    Limitations Sitting   Patient Stated Goals To increase his UE strength and stretch out his legs.    Pain Score 8    Pain Location Thoracic   Pain Orientation Right   Pain Onset More than a month ago   Pain Onset More than a month ago        Patient seen for e-stim IFC to right thoracic musculature x 20 mintues, followed by [place] x 1.5 cm squared x 10 minutes.  Patient has pain prior to treatment and following treatment 8/10. Attempted to reach wc  representative by phone and left a message.  Patient is independent with all transfers during therapy from wc to mat and mat to wc.                          PT Education - 08/23/14 1133    Education provided Yes   Education Details possibility of wc sitting causing thoracic spine pain due to scolosis curve and decreased support.    Person(s) Educated Patient   Methods Explanation   Comprehension Verbalized understanding             PT Long Term Goals - 07/31/14 1052    PT LONG TERM GOAL #1   Title Patient will be independent with HEP to increase UE and trunkal strength to decrease pain and increase tolerance for wheelchair mobility by7/26/2016.   Status Partially Met   PT LONG TERM GOAL #2   Title Patient will sit independently on mat table for at least 5 minutes with no UE or LE support to complete ADLs by 09/04/2014   Status Achieved   PT LONG TERM GOAL #3   Title Patient will report a worst VAS score of 4/10 in his lower back to allow for increased mobility tolerance by 09/04/2014.   Baseline 8/10 pain    Status On-going   PT LONG  TERM GOAL #4   Title Patient will not express pain symptoms in lower back with LE stretching program indicating improved tissue length and mobility to allow for increased mobility by 09/04/2014   Status On-going               Plan - 08/23/14 1135    Clinical Impression Statement Patient continues to have high level of pain in thoracic spine that is unchanged with position. He is spending most of his day in his wc or recliner at home or in bed. Discussed possibility of wc causing the increased pain due to scolosis curve and spending most of his time sitting now due to recent  paraplegia.    Pt will benefit from skilled therapeutic intervention in order to improve on the following deficits Cardiopulmonary status limiting activity;Decreased strength;Pain;Decreased balance;Decreased endurance;Improper body mechanics   Rehab  Potential Good   Clinical Impairments Affecting Rehab Potential History of spinal cord injury at T12 level.    PT Frequency 2x / week   PT Duration 4 weeks   PT Treatment/Interventions Cryotherapy;Electrical Stimulation;Moist Heat;Ultrasound;Therapeutic activities;Therapeutic exercise;Dry needling;Manual techniques   PT Next Visit Plan call wc provider to identify when new wc will arrive and discuss back support   PT Home Exercise Plan See patient instructions    Consulted and Agree with Plan of Care Patient        Problem List There are no active problems to display for this patient.   Alanson Puls 08/23/2014, 1:30 PM  [place] MAIN [facility] SERVICES [address] [place], [place], [address] Phone: [phone]   Fax:  [phone]

## 2014-08-29 ENCOUNTER — Ambulatory Visit: Payer: Medicare Other

## 2014-08-29 DIAGNOSIS — M545 Low back pain, unspecified: Secondary | ICD-10-CM

## 2014-08-29 DIAGNOSIS — M6281 Muscle weakness (generalized): Secondary | ICD-10-CM

## 2014-08-29 DIAGNOSIS — S298XXD Other specified injuries of thorax, subsequent encounter: Secondary | ICD-10-CM | POA: Diagnosis not present

## 2014-08-30 NOTE — Therapy (Signed)
[place] MAIN [facility] SERVICES [address] [place], [place], [address] Phone: [phone]   Fax:  [phone]  Physical Therapy Treatment/Physical Therapy Progress Note 07/31/14 to 08/29/14  Patient Details  Name: Christian Harmon MRN: [id number] Date of Birth: Mar 02, 1990 Referring Provider:  Kerney Elbe, [place]*  Encounter Date: 08/29/2014      PT End of Session - 08/30/14 0904    Visit Number 10   Number of Visits 18   Date for PT Re-Evaluation 09/26/14   Authorization Type 1/10   PT Start Time 1447   PT Stop Time 1530   PT Time Calculation (min) 43 min   Activity Tolerance Patient tolerated treatment well   Behavior During Therapy [facility] for tasks assessed/performed      Past Medical History  Diagnosis Date  . Spinal cord injury, sacral 06/25/14  . Paraplegia 05/30/2014  . Osteogenesis imperfecta   . Scoliosis   . 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 History of tethered spinal cord   . Neuropathy   . Traumatic fractures of multiple bones of hip and pelvis 11/2011    Past Surgical History  Procedure Laterality Date  . Excision radial head Left 11/26/11  . Osteoplasy - shortening of ulna l Left   . Neurolysis posterior interosseus nerve Left   . Forearm fasciotomy Left     dorsal and volar  . Appendectomy    . Joint replacement Right     hip    There were no vitals filed for this visit.  Visit Diagnosis:  Midline low back pain without sciatica - Plan: PT plan of care cert/re-cert  Muscle weakness - Plan: PT plan of care cert/re-cert      Subjective Assessment - 08/30/14 0857    Subjective pt reports his pain is always the same in his back regardless of what he does. pt points to his lower lumbar spine. pt reports he has a stander at home that he uses a few times a day, he reports he can do this independently. pt reports he stretches his legs using a band at home.    Patient is accompained by:  Family member   Pertinent History Patient had a spinal cord injury at T12 roughly later March, early April of this past year.    Limitations Sitting   Patient Stated Goals To increase his UE strength and stretch out his legs.    Pain Score 8    Pain Location --  lower back   Pain Onset More than a month ago   Pain Onset More than a month ago     PT exam: Hip PROM( pt not able to perform any AROM due to paralysis) L/R Flexion: 105/103 deg Extension 5/5 deg Knee ext/flexion: 0-125/0-125 deg Hip abduction: 45/43deg Hip adduction: 40/40 deg IR: 35/33deg ER 50/51deg L ankle PROM WNL with DF to 14 deg R ankle fused in neutral  (0 deg dorsiflexion)   MMT: Left:  Shoulder flexon: 4/5, , shoulder abduction 4/5, elbow flexion 4/5, elbow extension 4/5,  R  Shoulder flexion 4-/5, shoulder abduction 4-/5, elbow flexion 4-/5, elbow extension 4-/5 Scapular strength: Lower trap 3-/5, mid trap 3+/5, upper trap 5/5, shoulder extension 4/5, rhomboids 4-/5  Pt demonstrates independent WC to mat table transfer from lower to higher surface, and visa versa. Pt reports no increased LBP as it is "always the same" Pt cannot define aggs/eases of his symptoms Pt verbalizes using a stander independently at home,  as recommended by MD to improve circulation per pt.                              PT Education - 08/30/14 0858    Education provided Yes   Education Details pt educated regarding his LE flexibility including thie hips/knees ankles, all of which are within normal limits and not restricted. pt independent home stretching program is working well and no deficits were found in this area. pt was encouraged  to stretch slowly at home as he does have some spasticity with quick stretch.  pt educated that he has impaires strength of the scapular, core and UE musculature and strengthening in these areas may ease transfers and place less strain on his lumbar area.    Person(s) Educated  Patient   Methods Explanation   Comprehension Verbalized understanding             PT Long Term Goals - 08/30/14 0911    PT LONG TERM GOAL #1   Title Patient will be independent with HEP to increase UE and trunkal strength to decrease pain and increase tolerance for wheelchair mobility   Time 4   Period Weeks   Status Partially Met   PT LONG TERM GOAL #2   Title Patient will sit independently on mat table for at least 5 minutes with no UE or LE support to complete ADLs by 09/04/2014   Status Achieved   PT LONG TERM GOAL #3   Title Patient will report a worst VAS score of 4/10 in his lower back to allow for increased mobility tolerance by 09/04/2014.   Baseline 8/10 pain    Status On-going   PT LONG TERM GOAL #4   Title Patient will not express pain symptoms in lower back with LE stretching program indicating improved tissue length and mobility to allow for increased mobility by 09/04/2014   Baseline pt soft tissue length is WNL in the lower extremities    Status Deferred               Plan - 08/30/14 0905    Clinical Impression Statement PT re-assessed lower extremity flexibility due to pt/father request concerns. PT verbalizies independence in home stretching program which he reports includes using a stander a few times daily. pt LE flexibility was WNL in all areas with exception of the R ankle which is fused. pt does have some spasticity with quick stretching, so he was educated to make sure he flexes slowely and controlled at home. upon inspection of the trunk and UE, pt demonstrates impaired UE and scapular musculature strength. Since he is dependent on his UE for all mobility and transfers pt would benefit from strengthening in this area to reduce strain on his lower back and maximzie mobility, thus hopefully reducing pain. prior manual therpay technqiues have not been sucessful in reducing pts pain. pt joint mobilitzations are not indicated for this pt due to medical history  and history of extensive spinal surgeries. pt reports the only thing he has found to help his back pain is E-stim, which is temporary. pt reports he is working with MD to get a home Tens unit. PT exam reveals no lower extremity flexibility issues, lower back pain reports, reduced trunk ROM due to spinal fusions/surgeries, and impaired UE  and scapular strength. PT will be focusing on UE and scapular strengthening with aims to have pt on independent strengthening program for his as  well as continue with his stretching which is shown to be sucessful independnetly. pt  also continues to report and demonstrate safe and indepenent transfers for ADLs and mobility.    Pt will benefit from skilled therapeutic intervention in order to improve on the following deficits Cardiopulmonary status limiting activity;Decreased strength;Pain;Decreased balance;Decreased endurance;Improper body mechanics;Postural dysfunction;Impaired UE functional use   Rehab Potential Good   Clinical Impairments Affecting Rehab Potential History of spinal cord injury at T12 level, osteogenesis imperfecta   PT Frequency 2x / week   PT Duration 4 weeks   PT Treatment/Interventions Cryotherapy;Electrical Stimulation;Moist Heat;Ultrasound;Therapeutic activities;Therapeutic exercise;Dry needling;Manual techniques;Aquatic Therapy;Neuromuscular re-education   Consulted and Agree with Plan of Care Patient          G-Codes - Sep 23, 2014 0914    Functional Assessment Tool Used VAS, MMT, clinical judgement    Functional Limitation Changing and maintaining body position   Changing and Maintaining Body Position Current Status (N7182) At least 20 percent but less than 40 percent impaired, limited or restricted   Changing and Maintaining Body Position Goal Status (U9906) At least 1 percent but less than 20 percent impaired, limited or restricted      Problem List There are no active problems to display for this patient.  Gorden Harms. Jennilyn Esteve, PT,  DPT [phone]  Lonie Rummell 2014-09-23, 9:24 AM  [place] [facility] MAIN [facility] SERVICES [address] [place], [place], [address] Phone: [phone]   Fax:  [phone]

## 2014-09-03 ENCOUNTER — Ambulatory Visit: Payer: Medicare Other

## 2014-09-03 DIAGNOSIS — M6281 Muscle weakness (generalized): Secondary | ICD-10-CM

## 2014-09-03 DIAGNOSIS — S298XXD Other specified injuries of thorax, subsequent encounter: Secondary | ICD-10-CM | POA: Diagnosis not present

## 2014-09-04 NOTE — Therapy (Signed)
[place] MAIN [facility] SERVICES [address] [place], [place], [address] Phone: [phone]   Fax:  [phone]  Physical Therapy Treatment  Patient Details  Name: Christian Harmon MRN: [id number] Date of Birth: August 10, 1990 Referring Provider:  Kerney Elbe, [place]*  Encounter Date: 09/03/2014      PT End of Session - 09/04/14 0822    Visit Number 11   Number of Visits 18   Date for PT Re-Evaluation 09/26/14   Authorization Type 2/10   PT Start Time 1720   PT Stop Time 1800   PT Time Calculation (min) 40 min   Activity Tolerance Patient tolerated treatment well;No increased pain   Behavior During Therapy [facility] for tasks assessed/performed      Past Medical History  Diagnosis Date  . Spinal cord injury, sacral 06/25/14  . Paraplegia 05/30/2014  . Osteogenesis imperfecta   . Scoliosis   . 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 History of tethered spinal cord   . Neuropathy   . Traumatic fractures of multiple bones of hip and pelvis 11/2011    Past Surgical History  Procedure Laterality Date  . Excision radial head Left 11/26/11  . Osteoplasy - shortening of ulna l Left   . Neurolysis posterior interosseus nerve Left   . Forearm fasciotomy Left     dorsal and volar  . Appendectomy    . Joint replacement Right     hip    There were no vitals filed for this visit.  Visit Diagnosis:  Muscle weakness      Subjective Assessment - 09/04/14 0818    Subjective pt reports pain to his lower back. pt reports he spent some time on his stomach this morning as it gives him some relief temporarily. pt reports nothing seems to make his pain worse, it is always the same.    Pertinent History Patient had a spinal cord injury at T12 roughly later March, early April of this past year.    Patient Stated Goals to get stronger, hopefully to reduce LBP   Currently in Pain? Yes   Pain Score 8    Pain Location --  low back  (middle)   Pain Onset More than a month ago   Pain Onset More than a month ago        Therex: Following compliant of LBP; pt attempted lumbar flexion stretch over bolster, but was not able to feel stretch or pain relief. Deferred this exercise.  Mid rows: green band 3x10 Shoulder horiz abduction red band 3x10 0lbs shoulder scaption to end range ("y" in sitting") 3x10  Lumbar extension in sitting green band 3x10 Eccentric sit up- reclined to 30 deg: 2-5s hold 2x5  Serratus punch in sitting: green band 3x10 D2 shoulder flexion yellow band 2x10 Pavloff press: red band 2x10 Pt required min verbal, visual and tactile cues for scapular positioning. PT initiated short rest breaks to accommodate muscle fatigue.                          PT Education - 09/04/14 [phone]    Education provided Yes   Education Details importace of scapular and core strength regarding his specific needs and its relation to LBP   Person(s) Educated Patient   Methods Explanation   Comprehension Verbalized understanding             PT Long Term Goals - 08/30/14 3419  PT LONG TERM GOAL #1   Title Patient will be independent with HEP to increase UE and trunkal strength to decrease pain and increase tolerance for wheelchair mobility   Time 4   Period Weeks   Status Partially Met   PT LONG TERM GOAL #2   Title Patient will sit independently on mat table for at least 5 minutes with no UE or LE support to complete ADLs by 09/04/2014   Status Achieved   PT LONG TERM GOAL #3   Title Patient will report a worst VAS score of 4/10 in his lower back to allow for increased mobility tolerance by 09/04/2014.   Baseline 8/10 pain    Status On-going   PT LONG TERM GOAL #4   Title Patient will not express pain symptoms in lower back with LE stretching program indicating improved tissue length and mobility to allow for increased mobility by 09/04/2014   Baseline pt soft tissue length is WNL in the lower  extremities    Status Deferred               Plan - 09/04/14 0158    Clinical Impression Statement Initiated upper extremity, core, and scapular musculature strengthening today. pt reports he was quite challenged by this, which suprised him. pt reports he didnt realize how much strength he has lost. pt demonstrated upper extremity weakness L>R as well as impaired core and posture muscle strength. pt reported muscle fatigue only in session, no incresased pain. pt was advised that he may have muscle soreness tomorrow or the following day due to new strengthening program.    Pt will benefit from skilled therapeutic intervention in order to improve on the following deficits Cardiopulmonary status limiting activity;Decreased strength;Pain;Decreased balance;Decreased endurance;Improper body mechanics;Postural dysfunction;Impaired UE functional use   Rehab Potential Good   Clinical Impairments Affecting Rehab Potential History of spinal cord injury at T12 level, osteogenesis imperfecta   PT Frequency 2x / week   PT Duration 4 weeks   PT Treatment/Interventions Cryotherapy;Electrical Stimulation;Moist Heat;Ultrasound;Therapeutic activities;Therapeutic exercise;Dry needling;Manual techniques;Aquatic Therapy;Neuromuscular re-education   Consulted and Agree with Plan of Care Patient        Problem List There are no active problems to display for this patient.  Gorden Harms. Lorenda Grecco, PT, DPT [phone]   [place] Jenifer 09/04/2014, 8:27 AM  [place] MAIN [facility] SERVICES [address] [place], [place], [address] Phone: [phone]   Fax:  [phone]

## 2014-09-06 ENCOUNTER — Ambulatory Visit: Payer: Medicare Other

## 2014-09-10 ENCOUNTER — Ambulatory Visit: Payer: Medicare Other | Attending: Surgical

## 2014-09-10 DIAGNOSIS — M545 Low back pain, unspecified: Secondary | ICD-10-CM

## 2014-09-10 DIAGNOSIS — X58XXXA Exposure to other specified factors, initial encounter: Secondary | ICD-10-CM | POA: Diagnosis not present

## 2014-09-10 DIAGNOSIS — M6281 Muscle weakness (generalized): Secondary | ICD-10-CM

## 2014-09-10 DIAGNOSIS — S24104A Unspecified injury at T11-T12 level of thoracic spinal cord, initial encounter: Secondary | ICD-10-CM | POA: Insufficient documentation

## 2014-09-10 NOTE — Therapy (Signed)
[place] MAIN [facility] SERVICES [address] [place], [place], [address] Phone: [phone]   Fax:  [phone]  Physical Therapy Treatment  Patient Details  Name: Christian Harmon MRN: [id number] Date of Birth: 10-20-1990 Referring Provider:  Kerney Elbe, [place]*  Encounter Date: 09/10/2014      PT End of Session - 09/10/14 1411    Visit Number 12   Number of Visits 18   Date for PT Re-Evaluation 09/26/14   Authorization Type 3/10   PT Start Time 7262   PT Stop Time 1345   PT Time Calculation (min) 42 min   Activity Tolerance Patient tolerated treatment well;No increased pain   Behavior During Therapy [facility] for tasks assessed/performed      Past Medical History  Diagnosis Date  . Spinal cord injury, sacral 06/25/14  . Paraplegia 05/30/2014  . Osteogenesis imperfecta   . Scoliosis   . 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 History of tethered spinal cord   . Neuropathy   . Traumatic fractures of multiple bones of hip and pelvis 11/2011    Past Surgical History  Procedure Laterality Date  . Excision radial head Left 11/26/11  . Osteoplasy - shortening of ulna l Left   . Neurolysis posterior interosseus nerve Left   . Forearm fasciotomy Left     dorsal and volar  . Appendectomy    . Joint replacement Right     hip    There were no vitals filed for this visit.  Visit Diagnosis:  Muscle weakness  Midline low back pain without sciatica      Subjective Assessment - 09/10/14 1409    Subjective pt reports he had muscle soreness after last viist. reports his back pain is about the same. pt requests heat prior to therpay as it gives him some relief    Patient is accompained by: Family member   Pertinent History Patient had a spinal cord injury at T12 roughly later March, early April of this past year.    Limitations Sitting   Patient Stated Goals to get stronger, hopefully to reduce LBP   Currently in Pain?  Yes   Pain Score 8    Pain Location --  lower back   Pain Onset More than a month ago   Pain Onset More than a month ago       MHP applied to back- pt in prone prior to therapy x 5 min- no charge Mid rows: green band 3x10 Shoulder horiz abduction red band 3x10 0lbs shoulder scaption to end range ("y" in sitting") 3x10  Lumbar extension in sitting green band 3x10 Eccentric sit up- reclined to 30 deg: 2-5s hold 2x10  Serratus punch in sitting: green band 3x15 D2 shoulder flexion yellow band 3x10 Pavloff press: red band 2x10 Pt required min verbal, visual and tactile cues for scapular positioning. PT initiated short rest breaks to accommodate muscle fatigue.                                  PT Education - 09/10/14 1411    Education provided Yes   Education Details posture ed,              PT Long Term Goals - 08/30/14 0911    PT LONG TERM GOAL #1   Title Patient will be independent with HEP to increase UE and trunkal strength to decrease  pain and increase tolerance for wheelchair mobility   Time 4   Period Weeks   Status Partially Met   PT LONG TERM GOAL #2   Title Patient will sit independently on mat table for at least 5 minutes with no UE or LE support to complete ADLs by 09/04/2014   Status Achieved   PT LONG TERM GOAL #3   Title Patient will report a worst VAS score of 4/10 in his lower back to allow for increased mobility tolerance by 09/04/2014.   Baseline 8/10 pain    Status On-going   PT LONG TERM GOAL #4   Title Patient will not express pain symptoms in lower back with LE stretching program indicating improved tissue length and mobility to allow for increased mobility by 09/04/2014   Baseline pt soft tissue length is WNL in the lower extremities    Status Deferred               Plan - 09/10/14 1412    Clinical Impression Statement did not progress therex today due to DOMS after last visit. pt did not have increased pain  with therex today. continuing to focus on core and scapular strength. pt would benefit from continued skilled PT services to address strength in order to improve stability and reduce pain.    Pt will benefit from skilled therapeutic intervention in order to improve on the following deficits Cardiopulmonary status limiting activity;Decreased strength;Pain;Decreased balance;Decreased endurance;Improper body mechanics;Postural dysfunction;Impaired UE functional use   Rehab Potential Good   Clinical Impairments Affecting Rehab Potential History of spinal cord injury at T12 level, osteogenesis imperfecta   PT Frequency 2x / week   PT Duration 4 weeks   PT Treatment/Interventions Cryotherapy;Electrical Stimulation;Moist Heat;Ultrasound;Therapeutic activities;Therapeutic exercise;Dry needling;Manual techniques;Aquatic Therapy;Neuromuscular re-education   Consulted and Agree with Plan of Care Patient        Problem List There are no active problems to display for this patient.  Gorden Harms. Tami Barren, PT, DPT [phone]  [name] 09/10/2014, 2:14 PM  [place] MAIN [facility] SERVICES [address] [place], [place], [address] Phone: [phone]   Fax:  [phone]

## 2014-09-13 ENCOUNTER — Ambulatory Visit: Payer: Medicare Other

## 2014-09-13 DIAGNOSIS — M6281 Muscle weakness (generalized): Secondary | ICD-10-CM | POA: Diagnosis not present

## 2014-09-13 DIAGNOSIS — M545 Low back pain, unspecified: Secondary | ICD-10-CM

## 2014-09-13 NOTE — Therapy (Signed)
[place] MAIN [facility] SERVICES [address] [place], [place], [address] Phone: [phone]   Fax:  [phone]  Physical Therapy Treatment  Patient Details  Name: Christian Harmon MRN: [id number] Date of Birth: [birth year] Referring Provider:  Kerney Elbe, [place]*  Encounter Date: 09/13/2014      PT End of Session - 09/13/14 1338    Visit Number 13   Number of Visits 18   Date for PT Re-Evaluation 09/26/14   Authorization Type 4/10   PT Start Time 1135   PT Stop Time 1220   PT Time Calculation (min) 45 min   Activity Tolerance Patient tolerated treatment well;No increased pain   Behavior During Therapy [facility] for tasks assessed/performed      Past Medical History  Diagnosis Date  . Spinal cord injury, sacral 06/25/14  . Paraplegia 05/30/2014  . Osteogenesis imperfecta   . Scoliosis   . 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 History of tethered spinal cord   . Neuropathy   . Traumatic fractures of multiple bones of hip and pelvis 11/2011    Past Surgical History  Procedure Laterality Date  . Excision radial head Left 11/26/11  . Osteoplasy - shortening of ulna l Left   . Neurolysis posterior interosseus nerve Left   . Forearm fasciotomy Left     dorsal and volar  . Appendectomy    . Joint replacement Right     hip    There were no vitals filed for this visit.  Visit Diagnosis:  Muscle weakness  Midline low back pain without sciatica      Subjective Assessment - 09/13/14 1337    Subjective pt reports he is enjoying the strengthening program. He gets sore in his muscles, but feels it is helping his back pian improve some.    Patient is accompained by: Family member   Pertinent History Patient had a spinal cord injury at T12 roughly later March, early April of this past year.    Limitations Sitting   Patient Stated Goals to get stronger, hopefully to reduce LBP   Currently in Pain? Yes   Pain Score 6     Pain Location --  lower back   Pain Onset More than a month ago   Pain Onset More than a month ago      MHP to spine in prone x 5 min prior to therex- no charge  Therex: Prone "YTW" 3x10 each with min cues for correction Sit up with hand touch- (PT holding legs) 2x5, then 1x12 with cross over Eccentric sit up (to about 45 deg recline) 1UE elevated 3x5 D2 shoulder flexion yellow band 2x10 pavloff press  Red band 2x15 each side Russian twists 6lbs 2x20 Shoulder press- 7lbs RUE 2x10, 5lbs LUe 2x10 (1 UE at a time) Pt required cues for diaphragmatic breathing on exertion, min cues for proper performance. No increased pain. Short rest breaks for fatigue.                                 PT Long Term Goals - 08/30/14 0911    PT LONG TERM GOAL #1   Title Patient will be independent with HEP to increase UE and trunkal strength to decrease pain and increase tolerance for wheelchair mobility   Time 4   Period Weeks   Status Partially Met   PT LONG TERM GOAL #2  Title Patient will sit independently on mat table for at least 5 minutes with no UE or LE support to complete ADLs by 09/04/2014   Status Achieved   PT LONG TERM GOAL #3   Title Patient will report a worst VAS score of 4/10 in his lower back to allow for increased mobility tolerance by 09/04/2014.   Baseline 8/10 pain    Status On-going   PT LONG TERM GOAL #4   Title Patient will not express pain symptoms in lower back with LE stretching program indicating improved tissue length and mobility to allow for increased mobility by 09/04/2014   Baseline pt soft tissue length is WNL in the lower extremities    Status Deferred               Plan - 09/13/14 1338    Clinical Impression Statement pt arrived 76mn late for session, time was extended into the next hour to accomodate. progressed therex today, pt reports fatigue in the muscles, no increased pain. mother was present for treatment today.    Pt  will benefit from skilled therapeutic intervention in order to improve on the following deficits Cardiopulmonary status limiting activity;Decreased strength;Pain;Decreased balance;Decreased endurance;Improper body mechanics;Postural dysfunction;Impaired UE functional use   Rehab Potential Good   Clinical Impairments Affecting Rehab Potential History of spinal cord injury at T12 level, osteogenesis imperfecta   PT Frequency 2x / week   PT Duration 4 weeks   PT Treatment/Interventions Cryotherapy;Electrical Stimulation;Moist Heat;Ultrasound;Therapeutic activities;Therapeutic exercise;Dry needling;Manual techniques;Aquatic Therapy;Neuromuscular re-education        Problem List There are no active problems to display for this patient.  AGorden Harms Vernis Cabacungan, PT, DPT #[phone] Delana Manganello 09/13/2014, 1:40 PM  [place] [facility] [address] [address]Phone: [phone]  Fax:  [phone]

## 2014-09-17 ENCOUNTER — Ambulatory Visit: Payer: Medicare Other

## 2014-09-20 ENCOUNTER — Ambulatory Visit: Payer: Medicare Other

## 2014-09-24 ENCOUNTER — Ambulatory Visit: Payer: Medicare Other

## 2014-09-24 DIAGNOSIS — M545 Low back pain, unspecified: Secondary | ICD-10-CM

## 2014-09-24 DIAGNOSIS — M6281 Muscle weakness (generalized): Secondary | ICD-10-CM | POA: Diagnosis not present

## 2014-09-24 NOTE — Therapy (Signed)
[place] [facility] MAIN REHAB SERVICES [address] [place], [place], [address] Phone: [phone]   Fax:  [phone]  Physical Therapy Treatment / Physical Therapy Progress Note 08/30/14 to 09/24/14  Patient Details  Name: Christian Harmon MRN: [id number] Date of Birth: 11/30/1990 Referring Provider:  White, Nancy Ashley, PA*  Encounter Date: 09/24/2014      PT End of Session - 09/24/14 1353    Visit Number 14   Number of Visits 18   Date for PT Re-Evaluation 10/08/14   Authorization Type 5/10   PT Start Time 1125   PT Stop Time 1203   PT Time Calculation (min) 38 min   Activity Tolerance Patient tolerated treatment well;No increased pain   Behavior During Therapy [facility] for tasks assessed/performed      Past Medical History  Diagnosis Date  . Spinal cord injury, sacral 06/25/14  . Paraplegia 05/30/2014  . Osteogenesis imperfecta   . Scoliosis   . 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 History of tethered spinal cord   . Neuropathy   . Traumatic fractures of multiple bones of hip and pelvis 11/2011    Past Surgical History  Procedure Laterality Date  . Excision radial head Left 11/26/11  . Osteoplasy - shortening of ulna l Left   . Neurolysis posterior interosseus nerve Left   . Forearm fasciotomy Left     dorsal and volar  . Appendectomy    . Joint replacement Right     hip    There were no vitals filed for this visit.  Visit Diagnosis:  Muscle weakness - Plan: PT plan of care cert/re-cert  Midline low back pain without sciatica - Plan: PT plan of care cert/re-cert      Subjective Assessment - 09/24/14 1346    Subjective pt reports his lower back has been feeling ok, but he fell out of his WC over the weekend and bruised his L shoulder.    Patient is accompained by: Family member   Pertinent History Patient had a spinal cord injury at T12 roughly later March, early April of this past year.    Limitations  Sitting   Patient Stated Goals to get stronger, hopefully to reduce LBP   Currently in Pain? Yes   Pain Score 4   4/10 in L shoulder   Pain Onset More than a month ago   Aggravating Factors  exercise, heat   Pain Onset More than a month ago     pt arrived 10+ min late   Therex   Pt issued Tband rows (green) 3x10 Prone YTW 2x10 Serratus punch (green) 3x10 Pavlov press (red) 2x10 each side Russian twist with light med ball 2x20 Shoulder horiz abduction(red) 3x10 Shoulder D2 flexion (yellow) 2x10 Sit ups 2x10 Pt requires min cues for proper performance of exercise                           PT Education - 09/24/14 1353    Education provided Yes   Education Details HEP   Person(s) Educated Patient   Methods Explanation   Comprehension Verbalized understanding             PT Long Term Goals - 09/24/14 1356    PT LONG TERM GOAL #1   Title Patient will be independent with HEP to increase UE and trunkal strength to decrease pain and increase tolerance for wheelchair mobility   Time 4     Period Weeks   Status Partially Met   PT LONG TERM GOAL #2   Title Patient will sit independently on mat table for at least 5 minutes with no UE or LE support to complete ADLs by 09/04/2014   Status Achieved   PT LONG TERM GOAL #3   Title Patient will report a worst VAS score of 4/10 in his lower back to allow for increased mobility tolerance by 09/04/2014.   Baseline 8/10 pain    Status Achieved   PT LONG TERM GOAL #4   Title Patient will not express pain symptoms in lower back with LE stretching program indicating improved tissue length and mobility to allow for increased mobility by 09/04/2014   Baseline pt soft tissue length is WNL in the lower extremities    Status Deferred               Plan - 09/24/14 1354    Clinical Impression Statement pt has med most of his PT goals at this time. he is having less LBP. pt shoulder does not show significant injury  after his fall. pt was issued written HEP outlining the exercises preformed in therapy. pt still needs min cues for exercise correction at this time. pt would benefit from 1-2 more PT visits to secure proper HEP for continued HEP for independence in strengthening over his lifetime.    Pt will benefit from skilled therapeutic intervention in order to improve on the following deficits Cardiopulmonary status limiting activity;Decreased strength;Pain;Decreased balance;Decreased endurance;Improper body mechanics;Postural dysfunction;Impaired UE functional use   Rehab Potential Good   Clinical Impairments Affecting Rehab Potential History of spinal cord injury at T12 level, osteogenesis imperfecta   PT Frequency 1x / week   PT Duration 2 weeks   PT Treatment/Interventions Cryotherapy;Electrical Stimulation;Moist Heat;Ultrasound;Therapeutic activities;Therapeutic exercise;Dry needling;Manual techniques;Aquatic Therapy;Neuromuscular re-education        Problem List There are no active problems to display for this patient.  Ashley C. Tortorici, PT, DPT #[id number]  Tortorici,Ashley 09/24/2014, 1:59 PM  [place] [facility] MAIN REHAB SERVICES [address] [place], [place], [address] Phone: [phone]   Fax:  [phone]      

## 2014-09-24 NOTE — Patient Instructions (Signed)
[email or web address] Pt issued Tband rows (green) 3x10 Prone YTW 2x10 Serratus punch (green) 3x10 Pavlov press (red) 2x10 each side Guernsey twist with light med ball 2x20 Shoulder horiz abduction(red) 3x10 Shoulder D2 flexion (yellow) 2x10 Sit ups 2x10

## 2014-09-26 ENCOUNTER — Ambulatory Visit: Payer: Medicare Other

## 2014-09-26 DIAGNOSIS — M545 Low back pain, unspecified: Secondary | ICD-10-CM

## 2014-09-26 DIAGNOSIS — S24104A Unspecified injury at T11-T12 level of thoracic spinal cord, initial encounter: Secondary | ICD-10-CM

## 2014-09-26 DIAGNOSIS — M6281 Muscle weakness (generalized): Secondary | ICD-10-CM

## 2014-09-26 NOTE — Therapy (Signed)
[place] [facility] MAIN [facility] SERVICES [address] [place], [place], [address] Phone: [phone]   Fax:  [phone]  Physical Therapy Treatment / Discharge note  Patient Details  Name: Demarques Pilz MRN: [id number] Date of Birth: 1990-11-23 Referring Provider:  Loren Racer, [place]*  Encounter Date: 09/26/2014      PT End of Session - 09/26/14 1441    Visit Number 15   Number of Visits 18   Date for PT Re-Evaluation 10/08/14   Authorization Type 6/10   PT Start Time 1120   PT Stop Time 1200   PT Time Calculation (min) 40 min   Activity Tolerance Patient tolerated treatment well;No increased pain   Behavior During Therapy [facility] for tasks assessed/performed      Past Medical History  Diagnosis Date  . Spinal cord injury, sacral 06/25/14  . Paraplegia 05/30/2014  . Osteogenesis imperfecta   . Scoliosis   . 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 History of tethered spinal cord   . Neuropathy   . Traumatic fractures of multiple bones of hip and pelvis 11/2011    Past Surgical History  Procedure Laterality Date  . Excision radial head Left 11/26/11  . Osteoplasy - shortening of ulna l Left   . Neurolysis posterior interosseus nerve Left   . Forearm fasciotomy Left     dorsal and volar  . Appendectomy    . Joint replacement Right     hip    There were no vitals filed for this visit.  Visit Diagnosis:  Muscle weakness  Midline low back pain without sciatica  Injury at T11-T12 level of thoracic spinal cord, initial encounter      Subjective Assessment - 09/26/14 1435    Subjective pt reports overall his back pain now comes and goes, vs. constant. pt reports he feels confident performing his HEP independently    Patient is accompained by: Family member   Pertinent History Patient had a spinal cord injury at T12 roughly later March, early April of this past year.    Limitations Sitting   Patient Stated Goals  to get stronger, hopefully to reduce LBP   Pain Score --  "not too bad"   Pain Descriptors / Indicators --  lower back   Pain Onset More than a month ago   Pain Onset More than a month ago     Therex: pts hamstring and hip flexibility is WNL  Trunk turns with 4lbs med ball 2x10 Eccentric sit up at 90 deg, 60 deg, 30 deg 5s hold x 5 each Shoulder press: 7lbs RUE, 4lbs LUe 3x10 each Body blade for arm and core strength, abduction and L/R in front and to the side 15 s bouts. X 8 min Sit up with med ball 4lbs 2x10 Discussed performing some of his HEP in the stander.    MHP to lumbar spine following exercise x 5 min no charge                             PT Education - 09/26/14 1441    Education provided Yes   Education Details exercise progression including increasing reps, weight etc. continue strengthening program indefinately 3-4x/week to keep upperbody and back strong    Person(s) Educated Patient   Methods Explanation   Comprehension Verbalized understanding             PT Long Term Goals - 09/26/14  1445    PT LONG TERM GOAL #1   Title Patient will be independent with HEP to increase UE and trunkal strength to decrease pain and increase tolerance for wheelchair mobility   Time 4   Period Weeks   Status Achieved   PT LONG TERM GOAL #2   Title Patient will sit independently on mat table for at least 5 minutes with no UE or LE support to complete ADLs by 09/04/2014   Status Achieved   PT LONG TERM GOAL #3   Title Patient will report a worst VAS score of 4/10 in his lower back to allow for increased mobility tolerance by 09/04/2014.   Baseline 8/10 pain    Status Achieved   PT LONG TERM GOAL #4   Title Patient will not express pain symptoms in lower back with LE stretching program indicating improved tissue length and mobility to allow for increased mobility by 09/04/2014   Baseline pt soft tissue length is WNL in the lower extremities    Status  Deferred               Plan - 01-Oct-2014 1442    Clinical Impression Statement pt reports he was sucessful in setting up his home program for continued exercise following todays Dc. pt has made progress regarding his lower back pain regarding lower intensity of LBP and decreased frequency of severe pain. pt verbalizes his confidence in continuing his plan at home in addition to the previous sucessful stretching program he has in place. pt will be DC to HEP at this time as he demonstrates no functional impairment due to his reduced pain and is sucessful in his HEP. pt continues to demonstrate adequate flexibility for all functional activities.    Pt will benefit from skilled therapeutic intervention in order to improve on the following deficits Cardiopulmonary status limiting activity;Decreased strength;Pain;Decreased balance;Decreased endurance;Improper body mechanics;Postural dysfunction;Impaired UE functional use   Rehab Potential Good   Clinical Impairments Affecting Rehab Potential History of spinal cord injury at T12 level, osteogenesis imperfecta   PT Frequency 1x / week   PT Duration 2 weeks   PT Treatment/Interventions Cryotherapy;Electrical Stimulation;Moist Heat;Ultrasound;Therapeutic activities;Therapeutic exercise;Dry needling;Manual techniques;Aquatic Therapy;Neuromuscular re-education          G-Codes - 01-Oct-2014 1445    Functional Assessment Tool Used VAS, MMT, clinical judgement    Functional Limitation Changing and maintaining body position   Changing and Maintaining Body Position Goal Status (Z6109) At least 1 percent but less than 20 percent impaired, limited or restricted   Changing and Maintaining Body Position Discharge Status (U0454) At least 1 percent but less than 20 percent impaired, limited or restricted      Problem List There are no active problems to display for this patient.  Carlyon Shadow. Jarica Plass, PT, DPT [phone]   Deryl Ports 10/01/2014, 2:46  PM  [place] [facility] MAIN [facility] SERVICES [address] [place], [place], [address] Phone: [phone]   Fax:  [phone]

## 2014-10-02 ENCOUNTER — Ambulatory Visit: Payer: Medicare Other

## 2014-10-04 ENCOUNTER — Ambulatory Visit: Payer: Medicare Other

## 2014-10-08 ENCOUNTER — Ambulatory Visit: Payer: Medicare Other

## 2014-10-11 ENCOUNTER — Ambulatory Visit: Payer: Medicare Other

## 2014-10-17 ENCOUNTER — Ambulatory Visit: Payer: Medicare Other

## 2014-10-22 ENCOUNTER — Ambulatory Visit: Payer: Medicare Other

## 2014-10-24 ENCOUNTER — Ambulatory Visit: Payer: Medicare Other

## 2014-10-29 ENCOUNTER — Ambulatory Visit: Payer: Medicare Other

## 2014-10-31 ENCOUNTER — Ambulatory Visit: Payer: Medicare Other

## 2015-02-20 DIAGNOSIS — Q068 Other specified congenital malformations of spinal cord: Secondary | ICD-10-CM | POA: Insufficient documentation

## 2015-02-26 DIAGNOSIS — L7682 Other postprocedural complications of skin and subcutaneous tissue: Secondary | ICD-10-CM | POA: Insufficient documentation

## 2015-02-26 DIAGNOSIS — G8918 Other acute postprocedural pain: Secondary | ICD-10-CM | POA: Insufficient documentation

## 2015-04-06 DIAGNOSIS — M549 Dorsalgia, unspecified: Secondary | ICD-10-CM | POA: Insufficient documentation

## 2015-07-22 DIAGNOSIS — G8929 Other chronic pain: Secondary | ICD-10-CM | POA: Insufficient documentation

## 2015-07-22 DIAGNOSIS — M79605 Pain in left leg: Secondary | ICD-10-CM

## 2015-07-22 DIAGNOSIS — M79604 Pain in right leg: Secondary | ICD-10-CM | POA: Insufficient documentation

## 2015-07-22 DIAGNOSIS — M419 Scoliosis, unspecified: Secondary | ICD-10-CM | POA: Insufficient documentation

## 2015-07-22 DIAGNOSIS — Z87828 Personal history of other (healed) physical injury and trauma: Secondary | ICD-10-CM | POA: Insufficient documentation

## 2015-07-22 DIAGNOSIS — M792 Neuralgia and neuritis, unspecified: Secondary | ICD-10-CM | POA: Insufficient documentation

## 2015-08-16 DIAGNOSIS — S24109A Unspecified injury at unspecified level of thoracic spinal cord, initial encounter: Secondary | ICD-10-CM | POA: Insufficient documentation

## 2015-08-19 DIAGNOSIS — R569 Unspecified convulsions: Secondary | ICD-10-CM | POA: Insufficient documentation

## 2015-11-21 DIAGNOSIS — Z9189 Other specified personal risk factors, not elsewhere classified: Secondary | ICD-10-CM | POA: Insufficient documentation

## 2015-12-02 DIAGNOSIS — S52602A Unspecified fracture of lower end of left ulna, initial encounter for closed fracture: Secondary | ICD-10-CM

## 2015-12-02 DIAGNOSIS — S52502A Unspecified fracture of the lower end of left radius, initial encounter for closed fracture: Secondary | ICD-10-CM | POA: Insufficient documentation

## 2015-12-02 DIAGNOSIS — M79672 Pain in left foot: Secondary | ICD-10-CM | POA: Insufficient documentation

## 2016-01-19 DIAGNOSIS — R32 Unspecified urinary incontinence: Secondary | ICD-10-CM | POA: Insufficient documentation

## 2016-01-21 ENCOUNTER — Encounter: Payer: Self-pay | Admitting: Urology

## 2016-01-21 ENCOUNTER — Ambulatory Visit (INDEPENDENT_AMBULATORY_CARE_PROVIDER_SITE_OTHER): Payer: Medicare Other | Admitting: Urology

## 2016-01-21 VITALS — BP 125/93 | HR 77 | Ht 60.0 in | Wt 116.2 lb

## 2016-01-21 DIAGNOSIS — R350 Frequency of micturition: Secondary | ICD-10-CM

## 2016-01-21 DIAGNOSIS — N529 Male erectile dysfunction, unspecified: Secondary | ICD-10-CM | POA: Diagnosis not present

## 2016-01-21 DIAGNOSIS — N319 Neuromuscular dysfunction of bladder, unspecified: Secondary | ICD-10-CM

## 2016-01-21 DIAGNOSIS — N419 Inflammatory disease of prostate, unspecified: Secondary | ICD-10-CM | POA: Diagnosis not present

## 2016-01-21 MED ORDER — SULFAMETHOXAZOLE-TRIMETHOPRIM 800-160 MG PO TABS: 1.0000 | ORAL_TABLET | Freq: Two times a day (BID) | ORAL | 0 refills | Status: DC

## 2016-01-21 NOTE — Progress Notes (Signed)
01/21/2016 3:32 PM   Christian Harmon 05-12-1990 [id number]  Referring provider: No referring provider defined for this encounter.  Chief Complaint  Patient presents with  . New Patient (Initial Visit)    self referral  patient having trouble cathing himself    HPI: Patient is 25 year old Caucasian male with paraplegia who is managing his neurogenic bladder with CIC who over the last month has been experiencing gross hematuria and pain when cathing who presents with his mother, [name].    Prior to one month ago, he was cathing once to twice daily.  Two weeks ago, his need to CIC increased to four times daily.  He states he caths himself when he has a strong urge to urinate and only dribbles.  He than caths himself and a good amount of urine is returned.    He had an episode where he was doing his stretching exercises and heard a "pop."  He then felt numbness in his right leg and the loss of bladder control and urinated all over himself.  He then was seen in the ED and evaluated by neurosurgery.  He is currently under their management.  His bladder function is returning.    His currently having frequency, urgency, dysuria, nocturia, incontinence, intermittency, hesitancy, straining to urinate and a weak stream.   These have been new symptoms over the last month.    He is no experiencing gross hematuria with spontaneous voids.    He has not had fevers, chills, nausea or vomiting.    He also complains of ED, undescended right testicle and has a history of nephrolithiasis.    His UA today was unremarkable.     PMH: Past Medical History:  Diagnosis Date  . 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 History of tethered spinal cord (HCC)   . Neuropathy (HCC)   . Osteogenesis imperfecta   . Paraplegia (HCC) 05/30/2014  . Scoliosis   . Spinal cord injury, sacral (HCC) 06/25/14  . Traumatic fractures of multiple bones of hip and pelvis (HCC) 11/2011    Surgical  History: Past Surgical History:  Procedure Laterality Date  . APPENDECTOMY    . EXCISION RADIAL HEAD Left 11/26/11  . FOREARM FASCIOTOMY Left    dorsal and volar  . JOINT REPLACEMENT Right    hip  . neurolysis posterior interosseus nerve Left   . osteoplasy - shortening of ulna L Left     Home Medications:    Medication List       Accurate as of 01/21/16  3:32 PM. Always use your most recent med list.          amitriptyline 25 MG tablet Commonly known as:  ELAVIL Take 25 mg by mouth at bedtime.   celecoxib 200 MG capsule Commonly known as:  CELEBREX Take 200 mg by mouth 2 (two) times daily.   DULoxetine 60 MG capsule Commonly known as:  CYMBALTA Take 60 mg by mouth.   gabapentin 400 MG capsule Commonly known as:  NEURONTIN Take 400 mg by mouth 3 (three) times daily.   lidocaine 5 % Commonly known as:  LIDODERM Place 1 patch onto the skin daily. Remove & Discard patch within 12 hours or as directed by MD   methocarbamol 500 MG tablet Commonly known as:  ROBAXIN Take 500 mg by mouth 4 (four) times daily.   morphine 60 MG 24 hr capsule Commonly known as:  KADIAN Take 60 mg by mouth daily.  morphine 15 MG tablet Commonly known as:  MSIR Take 15 mg by mouth every 4 (four) hours as needed for severe pain.   Oxycodone HCl 10 MG Tabs Take 10 mg by mouth.   pregabalin 150 MG capsule Commonly known as:  LYRICA Take 150 mg by mouth.   sennosides-docusate sodium 8.6-50 MG tablet Commonly known as:  SENOKOT-S Take 1 tablet by mouth daily.   sulfamethoxazole-trimethoprim 800-160 MG tablet Commonly known as:  BACTRIM DS,SEPTRA DS Take 1 tablet by mouth every 12 (twelve) hours.   tiZANidine 4 MG capsule Commonly known as:  ZANAFLEX Take 8 mg by mouth.       Allergies:  Allergies  Allergen Reactions  . Bee Venom Anaphylaxis  . Ketamine Shortness Of Breath    Pt reports he stopped breathing and heart slowed, hospitalized for a week afterward  .  Ketorolac Hives and Other (See Comments)  . Onion Anaphylaxis    Pt reported that he avoids raw and cooked forms of onions. Causes anaphylaxis. SS 04/08/2015  . Other Hives  . Tape Rash    PLEASE USE PAPER TAPE!!    Family History: Family History  Problem Relation Age of Onset  . Prostate cancer Neg Hx   . Kidney cancer Neg Hx   . Bladder Cancer Neg Hx     Social History:  reports that he has never smoked. He uses smokeless tobacco. He reports that he does not drink alcohol or use drugs.  ROS: UROLOGY Frequent Urination?: Yes Hard to postpone urination?: Yes Burning/pain with urination?: Yes Get up at night to urinate?: Yes Leakage of urine?: Yes Urine stream starts and stops?: Yes Trouble starting stream?: Yes Blood in urine?: No Urinary tract infection?: No Sexually transmitted disease?: No Injury to kidneys or bladder?: No Painful intercourse?: No Weak stream?: Yes Erection problems?: Yes Penile pain?: Yes  Gastrointestinal Nausea?: Yes Vomiting?: No Indigestion/heartburn?: No Diarrhea?: No Constipation?: No  Constitutional Fever: No Night sweats?: Yes Weight loss?: No Fatigue?: No  Skin Skin rash/lesions?: No Itching?: No  Eyes Blurred vision?: No Double vision?: No  Ears/Nose/Throat Sore throat?: No Sinus problems?: No  Hematologic/Lymphatic Swollen glands?: No Easy bruising?: No  Cardiovascular Leg swelling?: Yes Chest pain?: No  Respiratory Cough?: No Shortness of breath?: No  Endocrine Excessive thirst?: No  Musculoskeletal Back pain?: Yes Joint pain?: Yes  Neurological Headaches?: Yes Dizziness?: Yes  Psychologic Depression?: No Anxiety?: No  Physical Exam: BP (!) 125/93   Pulse 77   Ht 5' (1.524 m)   Wt 116 lb 3.2 oz (52.7 kg)   BMI 22.69 kg/m   Constitutional: Well nourished. Alert and oriented, No acute distress. HEENT: [place] AT, moist mucus membranes. Trachea midline, no masses. Cardiovascular: No clubbing,  cyanosis, or edema. Respiratory: Normal respiratory effort, no increased work of breathing. GI: Abdomen is soft, non tender, non distended, no abdominal masses. Liver and spleen not palpable.  No hernias appreciated.  Stool sample for occult testing is not indicated.   GU: No CVA tenderness.  No bladder fullness or masses.  Patient with circumcised phallus.   Urethral meatus is patent.  No penile discharge. No penile lesions or rashes. Scrotum without lesions, cysts, rashes and/or edema.  Left testicle is located scrotally bilaterally.  No masses are appreciated in the testicles. Left epididymis are normal.  Right testicle palpable in the inguinal canal.   Rectal: Patient with  normal sphincter tone. Anus and perineum without scarring or rashes. No rectal masses are appreciated. Prostate is  approximately 35 grams, tender, indurated in the right lobe, no nodules are appreciated. Seminal vesicles are normal. Skin: No rashes, bruises or suspicious lesions. Lymph: No cervical or inguinal adenopathy. Neurologic: Grossly intact, no focal deficits, moving all 4 extremities. Psychiatric: Normal mood and affect.  Laboratory Data: Urinalysis Unremarkable.  See EPIC.    Assessment & Plan:    1. Urinary frequency  - Urinalysis, Complete  - improving as back pain is improving  - will reassess when RTC in 2 weeks  2. Neurogenic bladder  - patient is voiding on his own as well as CIC during moments of incomplete bladder emptying  - does not record volumes    3. Erectile dysfunction  - patient has been tried on Viagra and Cialis with minimal effectiveness  - given a sample of Stendra 200 mg, # 1 sample given  - will obtain a SHIM when RTC in 2 weeks  4. Prostatitis  - start Septra DS, one twice daily # 60  - RTC in weeks for symptom recheck   Return in about 2 weeks (around 02/04/2016) for exam and symptom recheck.  These notes generated with voice recognition software. I apologize for  typographical errors.  Michiel CowboySHANNON Odin Mariani, PA-C  [facility] [address], [address] [place], [place] [address] [phone]

## 2016-01-22 LAB — URINALYSIS, COMPLETE
BILIRUBIN UA: NEGATIVE
GLUCOSE, UA: NEGATIVE
KETONES UA: NEGATIVE
LEUKOCYTES UA: NEGATIVE
Nitrite, UA: NEGATIVE
RBC UA: NEGATIVE
SPEC GRAV UA: 1.025 (ref 1.005–1.030)
Urobilinogen, Ur: 0.2 mg/dL (ref 0.2–1.0)
pH, UA: 6 (ref 5.0–7.5)

## 2016-01-22 LAB — MICROSCOPIC EXAMINATION
Bacteria, UA: NONE SEEN
EPITHELIAL CELLS (NON RENAL): NONE SEEN /HPF (ref 0–10)

## 2016-01-23 ENCOUNTER — Ambulatory Visit (INDEPENDENT_AMBULATORY_CARE_PROVIDER_SITE_OTHER): Payer: Medicare Other

## 2016-01-23 ENCOUNTER — Ambulatory Visit (INDEPENDENT_AMBULATORY_CARE_PROVIDER_SITE_OTHER): Payer: Medicare Other | Admitting: Podiatry

## 2016-01-23 ENCOUNTER — Encounter: Payer: Self-pay | Admitting: Podiatry

## 2016-01-23 VITALS — BP 112/73 | HR 89 | Resp 16 | Ht 60.0 in | Wt 119.2 lb

## 2016-01-23 DIAGNOSIS — M779 Enthesopathy, unspecified: Secondary | ICD-10-CM

## 2016-01-23 DIAGNOSIS — M2010 Hallux valgus (acquired), unspecified foot: Secondary | ICD-10-CM | POA: Diagnosis not present

## 2016-01-23 MED ORDER — TRIAMCINOLONE ACETONIDE 10 MG/ML IJ SUSP
10.0000 mg | Freq: Once | INTRAMUSCULAR | Status: AC
  Administered 2016-01-23: 10 mg

## 2016-01-23 NOTE — Patient Instructions (Signed)
Bunionectomy A bunionectomy is a surgical procedure to remove a bunion. A bunion is a visible bump of bone on the inside of your foot where your big toe meets the rest of your foot. A bunion can develop when pressure turns this bone (first metatarsal) toward the other toes. Shoes that are too tight are the most common cause of bunions. Bunions can also be caused by diseases, such as arthritis and polio. You may need a bunionectomy if your bunion is very large and painful or it affects your ability to walk. Tell a health care provider about:  Any allergies you have.  All medicines you are taking, including vitamins, herbs, eye drops, creams, and over-the-counter medicines.  Any problems you or family members have had with anesthetic medicines.  Any blood disorders you have.  Any surgeries you have had.  Any medical conditions you have. What are the risks? Generally, this is a safe procedure. However, problems may occur, including:  Infection.  Pain.  Nerve damage.  Bleeding or blood clots.  Reactions to medicines.  Numbness, stiffness, or arthritis in your toe.  Foot problems that continue even after the procedure. What happens before the procedure?  Ask your health care provider about:  Changing or stopping your regular medicines. This is especially important if you are taking diabetes medicines or blood thinners.  Taking medicines such as aspirin and ibuprofen. These medicines can thin your blood. Do not take these medicines before your procedure if your health care provider instructs you not to.  Do not drink alcohol before the procedure as directed by your health care provider.  Do not use tobacco products, including cigarettes, chewing tobacco, or electronic cigarettes, before the procedure as directed by your health care provider. If you need help quitting, ask your health care provider.  Ask your health care provider what kind of medicine you will be given during  your procedure. A bunionectomy may be done using one of these:  A medicine that numbs the area (local anesthetic).  A medicine that makes you go to sleep (general anesthetic). If you will be given general anesthetic, do not eat or drink anything after midnight on the night before the procedure or as directed by your health care provider. What happens during the procedure?  An IV tube may be inserted into a vein.  You will be given local anesthetic or general anesthetic.  The surgeon will make a cut (incision) over the enlarged area at the first joint of the big toe. The surgeon will remove the bunion.  You may have more than one incision if any of the bones in your big toe need to be moved. A bone itself may need to be cut.  Sometimes the tissues around the big toe may also need to be cut then tightened or loosened to reposition the toe.  Screws or other hardware may be used to keep your foot in thecorrect position.  The incision will be closed with stitches (sutures) and covered with adhesive strips or another type of bandage (dressing). What happens after the procedure?  You may spend some time in a recovery area.  Your blood pressure, heart rate, breathing rate, and blood oxygen level will be monitored often until the medicines you were given have worn off. This information is not intended to replace advice given to you by your health care provider. Make sure you discuss any questions you have with your health care provider. Document Released: 01/09/2005 Document Revised: 07/04/2015 Document Reviewed: 09/13/2013   Elsevier Interactive Patient Education  2017 [employer]  

## 2016-01-23 NOTE — Progress Notes (Signed)
   Subjective:    Patient ID: Christian Harmon, male    DOB: Aug 02, 1990, 25 y.o.   MRN: [id number]  HPI Chief Complaint  Patient presents with  . Foot Pain    Left foot; bunion; pt stated, "Foot pain is making Physical Therapy hard"  . Toe Pain    Left foot; great toe-bottom of toe      Review of Systems  Musculoskeletal: Positive for arthralgias, back pain and myalgias.  All other systems reviewed and are negative.      Objective:   Physical Exam        Assessment & Plan:

## 2016-01-24 NOTE — Addendum Note (Signed)
Addended by: Marcell AngerSPEIGHT, Nicosha Struve P on: 01/24/2016 11:59 AM   Modules accepted: Orders

## 2016-01-24 NOTE — Progress Notes (Signed)
Subjective:     Patient ID: Christian Harmon, male   DOB: November 12, 1990, 25 y.o.   MRN: [id number]  HPI patient presents for bunion care and surgery from Dr. Thurston HoleWainer and had an accident 2 years ago is left paralyzed and is currently under rehabilitation and gradually return gaining sensitivity   Review of Systems  All other systems reviewed and are negative.      Objective:   Physical Exam  Constitutional: He is oriented to person, place, and time.  Cardiovascular: Intact distal pulses.   Musculoskeletal: Normal range of motion.  Neurological: He is oriented to person, place, and time.  Skin: Skin is warm.  Nursing note and vitals reviewed.  neurovascular status intact muscle strength adequate range of motion with patient noted to have hyperostosis medial aspect first metatarsal head left that red and it's very painful. He cannot wear shoe gear currently and is interfering with his rehabilitation. 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 At this point I have recommended we try injection treatment for the short-term to see if we can get some kind of relief of symptoms and patient tolerated this well and will be seen back in approximately 6 weeks to discuss surgical intervention to be done in February. Patient was made aware of the procedure will be necessary  X-ray report indicated that there is large structural bunion deformity left and we will repeat the x-ray weightbearing at next visit

## 2016-02-11 ENCOUNTER — Ambulatory Visit (INDEPENDENT_AMBULATORY_CARE_PROVIDER_SITE_OTHER): Payer: Medicare Other | Admitting: Urology

## 2016-02-11 ENCOUNTER — Encounter: Payer: Self-pay | Admitting: Urology

## 2016-02-11 VITALS — BP 124/83 | HR 80 | Ht 60.0 in | Wt 119.0 lb

## 2016-02-11 DIAGNOSIS — S63502A Unspecified sprain of left wrist, initial encounter: Secondary | ICD-10-CM | POA: Insufficient documentation

## 2016-02-11 DIAGNOSIS — M792 Neuralgia and neuritis, unspecified: Secondary | ICD-10-CM | POA: Insufficient documentation

## 2016-02-11 DIAGNOSIS — K5900 Constipation, unspecified: Secondary | ICD-10-CM

## 2016-02-11 DIAGNOSIS — M25562 Pain in left knee: Secondary | ICD-10-CM | POA: Insufficient documentation

## 2016-02-11 DIAGNOSIS — G5610 Other lesions of median nerve, unspecified upper limb: Secondary | ICD-10-CM | POA: Insufficient documentation

## 2016-02-11 DIAGNOSIS — N319 Neuromuscular dysfunction of bladder, unspecified: Secondary | ICD-10-CM | POA: Diagnosis not present

## 2016-02-11 DIAGNOSIS — R350 Frequency of micturition: Secondary | ICD-10-CM | POA: Diagnosis not present

## 2016-02-11 DIAGNOSIS — R310 Gross hematuria: Secondary | ICD-10-CM

## 2016-02-11 DIAGNOSIS — R2230 Localized swelling, mass and lump, unspecified upper limb: Secondary | ICD-10-CM | POA: Insufficient documentation

## 2016-02-11 DIAGNOSIS — N529 Male erectile dysfunction, unspecified: Secondary | ICD-10-CM

## 2016-02-11 DIAGNOSIS — N419 Inflammatory disease of prostate, unspecified: Secondary | ICD-10-CM | POA: Diagnosis not present

## 2016-02-11 DIAGNOSIS — M24562 Contracture, left knee: Secondary | ICD-10-CM | POA: Insufficient documentation

## 2016-02-11 DIAGNOSIS — Q740 Other congenital malformations of upper limb(s), including shoulder girdle: Secondary | ICD-10-CM | POA: Insufficient documentation

## 2016-02-11 DIAGNOSIS — Q068 Other specified congenital malformations of spinal cord: Secondary | ICD-10-CM | POA: Insufficient documentation

## 2016-02-11 DIAGNOSIS — M79632 Pain in left forearm: Secondary | ICD-10-CM | POA: Insufficient documentation

## 2016-02-11 DIAGNOSIS — G629 Polyneuropathy, unspecified: Secondary | ICD-10-CM | POA: Insufficient documentation

## 2016-02-11 DIAGNOSIS — Q6600 Congenital talipes equinovarus, unspecified foot: Secondary | ICD-10-CM | POA: Insufficient documentation

## 2016-02-11 DIAGNOSIS — G5632 Lesion of radial nerve, left upper limb: Secondary | ICD-10-CM | POA: Insufficient documentation

## 2016-02-11 DIAGNOSIS — Z7409 Other reduced mobility: Secondary | ICD-10-CM | POA: Insufficient documentation

## 2016-02-11 DIAGNOSIS — T84498A Other mechanical complication of other internal orthopedic devices, implants and grafts, initial encounter: Secondary | ICD-10-CM | POA: Insufficient documentation

## 2016-02-11 MED ORDER — TRAMADOL HCL 50 MG PO TABS: 50.0000 mg | ORAL_TABLET | Freq: Four times a day (QID) | ORAL | Status: AC

## 2016-02-11 NOTE — Progress Notes (Signed)
02/11/2016 3:02 PM   Arcelia JewNicholas Eichelberger 1990-06-02 [id number]  Referring provider: No referring provider defined for this encounter.  Chief Complaint  Patient presents with  . Follow-up    HPI: Patient is a 26 year old Caucasian male with paraplegia who presents with his mother, Zella BallRobin, was placed on Septra DS for symptoms of prostatitis.  Background history Patient is 26 year old Caucasian male with paraplegia who is managing his neurogenic bladder with CIC who over the last month has been experiencing gross hematuria and pain when cathing who presents with his mother, Zella BallRobin.   Prior to one month ago, he was cathing once to twice daily.  Two weeks ago, his need to CIC increased to four times daily.  He states he caths himself when he has a strong urge to urinate and only dribbles.  He than caths himself and a good amount of urine is returned.  He had an episode where he was doing his stretching exercises and heard a "pop."  He then felt numbness in his right leg and the loss of bladder control and urinated all over himself.  He then was seen in the ED and evaluated by neurosurgery.  He is currently under their management.  His bladder function is returning.  His currently having frequency, urgency, dysuria, nocturia, incontinence, intermittency, hesitancy, straining to urinate and a weak stream.   These have been new symptoms over the last month.  He is no experiencing gross hematuria with spontaneous voids.  He has not had fevers, chills, nausea or vomiting.  He also complains of ED, undescended right testicle and has a history of nephrolithiasis.  His UA was unremarkable.    Today, he is having worsening of his symptoms.  He is experiencing frequency, urgency, dysuria, nocturia, gross hematuria.  He states that he has not been able to urinate on his own and has been cathing several times a day.  He does not always get a large volume out.  He has seen stone fragments in his urine  He has not  had a BM for over one week.  He has tried laxatives without relief.  He also complains of having low grade fevers that only last for less than a day.    He has not tried the [place]endra.     PMH: Past Medical History:  Diagnosis Date  . 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 History of tethered spinal cord (HCC)   . Neuropathy (HCC)   . Osteogenesis imperfecta   . Paraplegia (HCC) 05/30/2014  . Scoliosis   . Spinal cord injury, sacral (HCC) 06/25/14  . Traumatic fractures of multiple bones of hip and pelvis (HCC) 11/2011    Surgical History: Past Surgical History:  Procedure Laterality Date  . APPENDECTOMY    . EXCISION RADIAL HEAD Left 11/26/11  . FOREARM FASCIOTOMY Left    dorsal and volar  . JOINT REPLACEMENT Right    hip  . neurolysis posterior interosseus nerve Left   . osteoplasy - shortening of ulna L Left     Home Medications:  Allergies as of 02/11/2016      Reactions   Bee Venom Anaphylaxis   Ketamine Shortness Of Breath   Pt reports he stopped breathing and heart slowed, hospitalized for a week afterward   Ketorolac Hives, Other (See Comments)   Onion Anaphylaxis   Pt reported that he avoids raw and cooked forms of onions. Causes anaphylaxis. SS 04/08/2015   Other Hives  Tape Rash   PLEASE USE PAPER TAPE!!      Medication List       Accurate as of 02/11/16  3:02 PM. Always use your most recent med list.          celecoxib 200 MG capsule Commonly known as:  CELEBREX Take 200 mg by mouth 2 (two) times daily.   gabapentin 400 MG capsule Commonly known as:  NEURONTIN Take 400 mg by mouth 3 (three) times daily.   Oxycodone HCl 10 MG Tabs TK 1 T PO Q 6 H AS NEEDED FOR UP TO 10 DAYS   pregabalin 150 MG capsule Commonly known as:  LYRICA Take 150 mg by mouth.   sulfamethoxazole-trimethoprim 800-160 MG tablet Commonly known as:  BACTRIM DS,SEPTRA DS Take 1 tablet by mouth 2 (two) times daily.       Allergies:  Allergies    Allergen Reactions  . Bee Venom Anaphylaxis  . Ketamine Shortness Of Breath    Pt reports he stopped breathing and heart slowed, hospitalized for a week afterward  . Ketorolac Hives and Other (See Comments)  . Onion Anaphylaxis    Pt reported that he avoids raw and cooked forms of onions. Causes anaphylaxis. SS 04/08/2015  . Other Hives  . Tape Rash    PLEASE USE PAPER TAPE!!    Family History: Family History  Problem Relation Age of Onset  . Prostate cancer Neg Hx   . Kidney cancer Neg Hx   . Bladder Cancer Neg Hx     Social History:  reports that he has never smoked. He uses smokeless tobacco. He reports that he does not drink alcohol or use drugs.  ROS: UROLOGY Frequent Urination?: Yes Hard to postpone urination?: Yes Burning/pain with urination?: Yes Get up at night to urinate?: Yes Leakage of urine?: No Urine stream starts and stops?: No Trouble starting stream?: No Do you have to strain to urinate?: No Blood in urine?: Yes Urinary tract infection?: No Sexually transmitted disease?: No Injury to kidneys or bladder?: No Painful intercourse?: No Weak stream?: No Erection problems?: Yes Penile pain?: No  Gastrointestinal Nausea?: Yes Vomiting?: No Indigestion/heartburn?: No Diarrhea?: No Constipation?: Yes  Constitutional Fever: Yes Night sweats?: Yes Weight loss?: No Fatigue?: No  Skin Skin rash/lesions?: No Itching?: No  Eyes Blurred vision?: No Double vision?: No  Ears/Nose/Throat Sore throat?: No Sinus problems?: No  Hematologic/Lymphatic Swollen glands?: No Easy bruising?: No  Cardiovascular Leg swelling?: No Chest pain?: No  Respiratory Cough?: No Shortness of breath?: No  Endocrine Excessive thirst?: No  Musculoskeletal Back pain?: Yes Joint pain?: No  Neurological Headaches?: No Dizziness?: No  Psychologic Depression?: No Anxiety?: No  Physical Exam: BP 124/83 (BP Location: Left Arm, Patient Position: Sitting,  Cuff Size: Normal)   Pulse 80   Ht 5' (1.524 m)   Wt 119 lb (54 kg)   BMI 23.24 kg/m   Constitutional: Well nourished. Alert and oriented, No acute distress. HEENT: [place] AT, moist mucus membranes. Trachea midline, no masses. Cardiovascular: No clubbing, cyanosis, or edema. Respiratory: Normal respiratory effort, no increased work of breathing. Skin: No rashes, bruises or suspicious lesions. Lymph: No cervical or inguinal adenopathy. Neurologic: Grossly intact, no focal deficits, moving all 4 extremities. Psychiatric: Normal mood and affect.   Assessment & Plan:    1. Urinary frequency  - has not returned to baseline  - CIC x 4 or more daily  - aggravated by constipation  2. Neurogenic bladder  - patient is not  longer voiding on his own   - does not record volumes  - Patient caths four times daily, indefinitely due to urinary retention.      3. Erectile dysfunction  - given a sample of Stendra 200 mg, # 1 sample given; patient has not tried the samples    4. Prostatitis  - continue Septra DS, one twice daily # 60  - CT scan is pending  5. Constipation  - advised patient to take a laxative; dulcolax  6. Gross hematuria  - patient stated he is passing stone fragments via straight cath  - obtain CT scan  - Advised to contact our office or seek treatment in the ED if becomes febrile or pain/ vomiting are difficult control in order to arrange for emergent/urgent intervention   No Follow-up on file.  These notes generated with voice recognition software. I apologize for typographical errors.  Michiel Cowboy, PA-C  [facility] [address] [place], [place] [address] [phone]

## 2016-02-12 ENCOUNTER — Telehealth: Payer: Self-pay | Admitting: *Deleted

## 2016-02-12 ENCOUNTER — Ambulatory Visit
Admission: RE | Admit: 2016-02-12 | Discharge: 2016-02-12 | Disposition: A | Discharge status: Home / Self Care | Payer: Medicare Other | Source: Ambulatory Visit | Attending: Urology | Admitting: Urology

## 2016-02-12 DIAGNOSIS — R310 Gross hematuria: Secondary | ICD-10-CM | POA: Diagnosis not present

## 2016-02-12 DIAGNOSIS — Z981 Arthrodesis status: Secondary | ICD-10-CM | POA: Insufficient documentation

## 2016-02-12 DIAGNOSIS — K6289 Other specified diseases of anus and rectum: Secondary | ICD-10-CM | POA: Diagnosis present

## 2016-02-12 DIAGNOSIS — Q7649 Other congenital malformations of spine, not associated with scoliosis: Secondary | ICD-10-CM | POA: Diagnosis not present

## 2016-02-12 DIAGNOSIS — M419 Scoliosis, unspecified: Secondary | ICD-10-CM | POA: Diagnosis not present

## 2016-02-12 NOTE — Telephone Encounter (Signed)
-----   Message from Harle BattiestShannon A McGowan, PA-C sent at 02/12/2016  4:12 PM EST ----- Please notify the patient that his CT scan did not show any urinary stones, prostate abscess or impaction of stool.  I would advise him to continue the Septra DS, Dulcolax and tylenol/ibuprophen for the pain.  Prostate infection can take up to 30 days to resolve.  I will also be discussing his situation with the other providers to get their ideas on the next step.

## 2016-02-12 NOTE — Telephone Encounter (Signed)
LMOM for patient to call back.

## 2016-02-12 NOTE — Telephone Encounter (Signed)
Patient states Carollee HerterShannon gave him Tramadol yesterday and it never made it to the pharmacy. Also Pharmacist states that he is allergic to that medication. I let him know that he did not give the information as an allergy at his appointment yesterday. He said his mom filled it out. He also wanted her to know that he tried the medication she recommended to help him with the constipation and he states he took it and still nothing. I advised him to continue with ibuprofen and tylenol for pain and I would let Carollee HerterShannon know. Patient ok with the plan.

## 2016-02-13 NOTE — Telephone Encounter (Signed)
Pt called and I read Shannon's message to him.  He has other questions, if you will please give him a call at [phone].

## 2016-02-13 NOTE — Telephone Encounter (Signed)
Spoke with patient and he is concerned of why he can't go to the bathroom. I let him know again about his CT results and it showing no impaction and per Carollee HerterShannon he will need to contact his PCP about his constipation and that we the other doctors are consulted we will let him know the next step as far as the blood in his urine. Patient ok with the plan.

## 2016-02-18 ENCOUNTER — Telehealth: Payer: Self-pay

## 2016-02-18 NOTE — Telephone Encounter (Signed)
Joe, from [facility], called stating that [facility] has attempted to reach out to pt several times without any success. Per Gabriel RungJoe is it [facility] protocol due to insurance purposes no medical supplies can be shipped until the pt is reached.  Joe stated he will attempt to reach out to pt one more time. If Gabriel RungJoe is not able to reach pt then [facility] will try also.

## 2016-03-11 ENCOUNTER — Ambulatory Visit: Payer: Medicare Other | Admitting: Podiatry

## 2016-03-13 ENCOUNTER — Telehealth: Payer: Self-pay | Admitting: *Deleted

## 2016-03-13 ENCOUNTER — Ambulatory Visit (INDEPENDENT_AMBULATORY_CARE_PROVIDER_SITE_OTHER): Payer: Self-pay | Admitting: Podiatry

## 2016-03-13 ENCOUNTER — Encounter: Payer: Self-pay | Admitting: Podiatry

## 2016-03-13 DIAGNOSIS — M2010 Hallux valgus (acquired), unspecified foot: Secondary | ICD-10-CM

## 2016-03-13 NOTE — Telephone Encounter (Signed)
"  Someone keeps calling me from your department.  I think they are trying to schedule my Bunion surgery but I am not sure.  Please give me a call back so we can get this squared away.  I would greatly appreciate it."

## 2016-03-13 NOTE — Patient Instructions (Signed)

## 2016-03-16 NOTE — Telephone Encounter (Signed)
"  I saw Dr. Charlsie Merlesegal last week and scheduled surgery for February 27.  I need to reschedule it.  I didn't realize that I have other appointments that day."  Dr. Charlsie Merlesegal can do it on March 6.  "Does he have anything sooner?"  No, he does not have anything else.  "Okay, put me down for then."

## 2016-04-07 ENCOUNTER — Telehealth: Payer: Self-pay | Admitting: Podiatry

## 2016-04-07 NOTE — Telephone Encounter (Signed)
Called patient and left a voicemail to see if he could come in this afternoon between 1:30 pm - 3:30 pm to have new x-rays taken of his left foot for his surgery that is scheduled for next Tuesday 06 March. In the message I told him if he could, he could come between the above time and just say that he is here to have updated x-rays done for his scheduled surgery and that we did not need to schedule him an appointment.

## 2016-04-07 NOTE — Telephone Encounter (Signed)
After talking to Lahoma CrockerJessica Q., RN, I called the patient's mother to see if she could bring him in prior to Tuesday to have x-rays retaken for his scheduled surgery since the images are not in our system. Patient's mother stated she will be by Friday 02 March between 1:00 - 2:00 pm to either have x-rays taken or bring a CD from Dr. Thurston HoleWainer. Okayed per RN, Lahoma CrockerJessica Q.

## 2016-04-10 ENCOUNTER — Ambulatory Visit (INDEPENDENT_AMBULATORY_CARE_PROVIDER_SITE_OTHER): Payer: Medicare Other

## 2016-04-10 ENCOUNTER — Ambulatory Visit (INDEPENDENT_AMBULATORY_CARE_PROVIDER_SITE_OTHER): Payer: Self-pay | Admitting: Podiatry

## 2016-04-10 DIAGNOSIS — M21619 Bunion of unspecified foot: Secondary | ICD-10-CM

## 2016-04-12 NOTE — Progress Notes (Addendum)
Subjective:     Patient ID: Christian Harmon, male   DOB: [birth year], 26 y.o.   MRN: [id number]  HPI patient presents for x-ray structural bunion   Review of Systems     Objective:   Physical Exam Chronic bunion deformity left that he cannot wear shoe gear with    Assessment:     Structural bunion    Plan:     Preoperative x-rays performed today. This was performed by the assistants as he is already seen me for his preoperative consultation

## 2016-04-13 ENCOUNTER — Telehealth: Payer: Self-pay | Admitting: *Deleted

## 2016-04-13 NOTE — Telephone Encounter (Signed)
"  I'm having surgery tomorrow.  Someone from the surgical center called but I couldn't understand what they were saying.  Call me back, I'd greatly appreciate it."  I'm returning your call.  How can I help you?  "I actually figured it out.  I'm online filling it out now."

## 2016-04-14 ENCOUNTER — Encounter: Payer: Self-pay | Admitting: Podiatry

## 2016-04-14 DIAGNOSIS — M2012 Hallux valgus (acquired), left foot: Secondary | ICD-10-CM | POA: Diagnosis not present

## 2016-04-16 ENCOUNTER — Telehealth: Payer: Self-pay | Admitting: *Deleted

## 2016-04-16 MED ORDER — HYDROMORPHONE HCL 4 MG PO TABS: 4.0000 mg | ORAL_TABLET | Freq: Four times a day (QID) | ORAL | 0 refills | Status: DC | PRN

## 2016-04-16 NOTE — Telephone Encounter (Addendum)
Pt presents to office stating surgery foot is killing him, with sharp and throbbing pt. I spoke with Dr. Al CorpusHyatt and he ordered Dilaudid 4mg  #30 one tablet every 6 hours, take if tolerates OTC tylenol or OTC ibuprofen as package instructs in between doses of pain medication, do not take anymore Hydrocodone. I informed pt of Dr. Al CorpusHyatt orders and warnings, and I instructed pt to once he returned home, remove cam boot, open-ended sock, ace wrap and elevate the left foot for 15 minutes, if pain worsens then dangle for 15 minutes, this is the only time it is okay to dangle the surgery foot, after 15 minutes place foot level with hip and rewrap ace from toes up leg looser and reapply sock and boot. Pt states understanding.05/08/2016-Pt states he stubbed his toe and heard a pop and is having a lot of pain, would like a refill of his pain medication so he can get to his appt on 05/11/2016. Dr. Everlena Cooperegal okayed the refill of the Percocet, but pt [place] need [place]. Orders routed to Dr. Logan BoresEvans in the [place] office.05/12/2016-[place]ley - [facility] states pt received Percocet 10/325 #180 one tablet every 4 hours from another doctor on 04/27/2016, and asked if we still wanted pt to get the rx ordered 05/11/2016. I spoke with Laney PotashAshley - [facility] and she states pt got angry because she called and took his prescription back. Morrie Sheldonshley said pt took the #180 Percocet to a take back center. I told Morrie Sheldonshley I would inform Dr. Regal.05/15/2016-Pt states he was given an antibiotic by the doctor he saw in [place], pt states his foot is puffy and he thinks he needs more antibiotics. I spoke with Dr. Logan BoresEvans, he states he did not see pt, pt waited in the lobby for the oxycodone rx, but can prescribe Doxycycline 100mg  #10 one capsule bid until gone, and make an appt to see Dr. Charlsie Merlesegal Monday.I informed pt of Dr. Logan BoresEvans orders. Pt states his foot hurts so bad, and the doctor in [place] gave him an antibiotic, on Friday. I told  pt Dr. Logan BoresEvans in [place] on Friday gave him a narcotic pain medication, and if he needed pain medication he would need to be seen in the ER, because he was given 180 Percocet by a doctor not in the [facility] system, and our doctors had given him the same medication during the same time period. I told pt that if his symptoms worsened he would need to be seen in the ER. I told pt I would transfer to the schedulers to get an appt for Monday 05/18/2016, pt states understanding. Pt asked if he could have his mom pick up the prescription in [place] and I told him I would inform the staff there.05/18/2016-[place] - Dr. Nilda RiggsAndrew [place] office states they have been treating pt for many years for pain management and in the last several months they are seeing  pt receiving multiple pain medications from multiple providers even out of state. Silvio PateShelia states that at his last visit with DR. Cohmen pt stated his foot was great, just his wrist hurt.

## 2016-04-22 ENCOUNTER — Ambulatory Visit (INDEPENDENT_AMBULATORY_CARE_PROVIDER_SITE_OTHER): Payer: Medicare Other

## 2016-04-22 ENCOUNTER — Ambulatory Visit (INDEPENDENT_AMBULATORY_CARE_PROVIDER_SITE_OTHER): Payer: Medicare Other | Admitting: Podiatry

## 2016-04-22 ENCOUNTER — Encounter: Payer: Self-pay | Admitting: Podiatry

## 2016-04-22 VITALS — Temp 97.3°F

## 2016-04-22 DIAGNOSIS — M2010 Hallux valgus (acquired), unspecified foot: Secondary | ICD-10-CM | POA: Diagnosis not present

## 2016-04-22 DIAGNOSIS — M21619 Bunion of unspecified foot: Secondary | ICD-10-CM

## 2016-04-22 MED ORDER — OXYCODONE-ACETAMINOPHEN 10-325 MG PO TABS
1.0000 | ORAL_TABLET | ORAL | 0 refills | Status: DC | PRN
Start: 2016-04-22 — End: 2016-05-08

## 2016-04-24 NOTE — Progress Notes (Signed)
Subjective:     Patient ID: Christian Harmon, male   DOB: Apr 14, 1990, 26 y.o.   MRN: [id number]  HPI patient states she's doing very well with his left foot but is having quite a bit of pain as he's had a lot of problems with his back and other issues over the past and is not sensitive to pain medication   Review of Systems     Objective:   Physical Exam Neurovascular status intact negative Homans sign was noted with patient's left first metatarsal looking excellent as far as correction with wound edges well coapted good alignment and no structural deformity noted clinically    Assessment:     Doing well post surgery left with pain was to be expected given his health conditions    Plan:     H&P x-rays reviewed and reapplied sterile dressing with instructions on continued elevation compression immobilization. I did write him for more Percocet for the short period and I advised the importance of elevation to him. He'll be seen back in 2 weeks or earlier if any issues should occur  X-ray report indicates pins are in place good alignment is noted with no indications of significant movement with possible minimal movement but it should be stable and will be watched with patient and advised on the continuation of immobilization

## 2016-05-08 ENCOUNTER — Other Ambulatory Visit: Payer: Self-pay

## 2016-05-08 MED ORDER — OXYCODONE-ACETAMINOPHEN 10-325 MG PO TABS: 1.0000 | ORAL_TABLET | ORAL | 0 refills | Status: DC | PRN

## 2016-05-08 NOTE — Telephone Encounter (Signed)
rx has been printed, signed and given to patient at front desk

## 2016-05-11 ENCOUNTER — Ambulatory Visit (INDEPENDENT_AMBULATORY_CARE_PROVIDER_SITE_OTHER): Payer: Medicare Other | Admitting: Podiatry

## 2016-05-11 ENCOUNTER — Ambulatory Visit (INDEPENDENT_AMBULATORY_CARE_PROVIDER_SITE_OTHER): Payer: Medicare Other

## 2016-05-11 DIAGNOSIS — M21619 Bunion of unspecified foot: Secondary | ICD-10-CM

## 2016-05-11 DIAGNOSIS — M2010 Hallux valgus (acquired), unspecified foot: Secondary | ICD-10-CM

## 2016-05-11 MED ORDER — OXYCODONE-ACETAMINOPHEN 10-325 MG PO TABS: 1.0000 | ORAL_TABLET | ORAL | 0 refills | Status: DC | PRN

## 2016-05-12 NOTE — Progress Notes (Signed)
Subjective:     Patient ID: Christian Harmon, male   DOB: 02/11/1990, 26 y.o.   MRN: [id number]  HPI patient states he starting to feel better but he did have redness and infection and is on an antibiotic and has developed pain again in his foot even though improvement is fair in comparison to last week   Review of Systems     Objective:   Physical Exam Neurovascular status intact negative Homans sign was noted with patient's incision site left being well coapted with good range of motion first MPJ and moderate discomfort when palpated with discomfort in the joint when palpated    Assessment:     Doing well overall with mild pain out of proportion and no current indication of infection    Plan:     Advised on finishing the antibiotics continuing to monitor the area and I did write him for less Percocet and this will be the last prescription we will give him. He we'll use it sparingly and is encouraged to elevate his foot as much as possible  X-rays indicate good healing of the osteotomies pins in place good structural alignment with no signs of movement

## 2016-05-15 MED ORDER — DOXYCYCLINE HYCLATE 100 MG PO TABS: 100.0000 mg | ORAL_TABLET | Freq: Two times a day (BID) | ORAL | 0 refills | Status: DC

## 2016-05-15 NOTE — Telephone Encounter (Signed)
rx has been printed and signed for Doxycycline .  Script has been left at front desk for patient pick up

## 2016-05-18 ENCOUNTER — Encounter: Payer: Medicare Other | Admitting: Podiatry

## 2016-05-19 NOTE — Telephone Encounter (Signed)
No more pain medicine

## 2016-06-08 ENCOUNTER — Encounter: Payer: Medicare Other | Admitting: Podiatry

## 2016-06-08 NOTE — Progress Notes (Signed)
Surgery consult

## 2016-06-08 NOTE — Progress Notes (Signed)
This encounter was created in error - please disregard.

## 2016-07-16 NOTE — Progress Notes (Signed)
1. Austin bunionectomy (cutting and move bone) left with pin

## 2016-09-10 ENCOUNTER — Telehealth: Payer: Self-pay

## 2016-09-10 NOTE — Telephone Encounter (Signed)
Left patient mess to call in regards to cath CIC script

## 2016-09-21 NOTE — Telephone Encounter (Signed)
Called patient and left a message to call back in regards to CIC script, need to know how many times daily.

## 2016-10-24 ENCOUNTER — Emergency Department: Payer: Medicare Other

## 2016-10-24 ENCOUNTER — Emergency Department
Admission: EM | Admit: 2016-10-24 | Discharge: 2016-10-25 | Disposition: A | Discharge status: Home / Self Care | Payer: Medicare Other | Attending: Emergency Medicine | Admitting: Emergency Medicine

## 2016-10-24 ENCOUNTER — Encounter: Payer: Self-pay | Admitting: Emergency Medicine

## 2016-10-24 DIAGNOSIS — F17290 Nicotine dependence, other tobacco product, uncomplicated: Secondary | ICD-10-CM | POA: Insufficient documentation

## 2016-10-24 DIAGNOSIS — M5412 Radiculopathy, cervical region: Secondary | ICD-10-CM | POA: Insufficient documentation

## 2016-10-24 DIAGNOSIS — Z96641 Presence of right artificial hip joint: Secondary | ICD-10-CM | POA: Insufficient documentation

## 2016-10-24 DIAGNOSIS — M25511 Pain in right shoulder: Secondary | ICD-10-CM | POA: Diagnosis not present

## 2016-10-24 DIAGNOSIS — M542 Cervicalgia: Secondary | ICD-10-CM | POA: Diagnosis present

## 2016-10-24 DIAGNOSIS — W19XXXA Unspecified fall, initial encounter: Secondary | ICD-10-CM

## 2016-10-24 DIAGNOSIS — M25519 Pain in unspecified shoulder: Secondary | ICD-10-CM

## 2016-10-24 DIAGNOSIS — W050XXA Fall from non-moving wheelchair, initial encounter: Secondary | ICD-10-CM | POA: Insufficient documentation

## 2016-10-24 LAB — CBC WITH DIFFERENTIAL/PLATELET
BASOS ABS: 0 10*3/uL (ref 0–0.1)
Basophils Relative: 1 %
EOS PCT: 0 %
Eosinophils Absolute: 0 10*3/uL (ref 0–0.7)
HEMATOCRIT: 33.8 % — AB (ref 40.0–52.0)
Hemoglobin: 11.4 g/dL — ABNORMAL LOW (ref 13.0–18.0)
LYMPHS ABS: 2 10*3/uL (ref 1.0–3.6)
Lymphocytes Relative: 24 %
MCH: 27.7 pg (ref 26.0–34.0)
MCHC: 33.8 g/dL (ref 32.0–36.0)
MCV: 81.9 fL (ref 80.0–100.0)
MONOS PCT: 9 %
Monocytes Absolute: 0.7 10*3/uL (ref 0.2–1.0)
Neutro Abs: 5.5 10*3/uL (ref 1.4–6.5)
Neutrophils Relative %: 66 %
PLATELETS: 226 10*3/uL (ref 150–440)
RBC: 4.13 MIL/uL — ABNORMAL LOW (ref 4.40–5.90)
RDW: 13.4 % (ref 11.5–14.5)
WBC: 8.3 10*3/uL (ref 3.8–10.6)

## 2016-10-24 LAB — COMPREHENSIVE METABOLIC PANEL
ALT: 30 U/L (ref 17–63)
AST: 23 U/L (ref 15–41)
Albumin: 4.2 g/dL (ref 3.5–5.0)
Alkaline Phosphatase: 63 U/L (ref 38–126)
Anion gap: 11 (ref 5–15)
BILIRUBIN TOTAL: 0.4 mg/dL (ref 0.3–1.2)
BUN: 18 mg/dL (ref 6–20)
CO2: 26 mmol/L (ref 22–32)
CREATININE: 0.61 mg/dL (ref 0.61–1.24)
Calcium: 8.3 mg/dL — ABNORMAL LOW (ref 8.9–10.3)
Chloride: 103 mmol/L (ref 101–111)
Glucose, Bld: 107 mg/dL — ABNORMAL HIGH (ref 65–99)
Potassium: 3.5 mmol/L (ref 3.5–5.1)
Sodium: 140 mmol/L (ref 135–145)
TOTAL PROTEIN: 7.1 g/dL (ref 6.5–8.1)

## 2016-10-24 LAB — URINALYSIS, COMPLETE (UACMP) WITH MICROSCOPIC
Bacteria, UA: NONE SEEN
Bilirubin Urine: NEGATIVE
GLUCOSE, UA: NEGATIVE mg/dL
HGB URINE DIPSTICK: NEGATIVE
Ketones, ur: NEGATIVE mg/dL
Leukocytes, UA: NEGATIVE
NITRITE: NEGATIVE
PH: 6 (ref 5.0–8.0)
Protein, ur: NEGATIVE mg/dL
SPECIFIC GRAVITY, URINE: 1.025 (ref 1.005–1.030)
Squamous Epithelial / LPF: NONE SEEN

## 2016-10-24 LAB — LACTIC ACID, PLASMA: Lactic Acid, Venous: 1 mmol/L (ref 0.5–1.9)

## 2016-10-24 MED ORDER — MORPHINE SULFATE (PF) 4 MG/ML IV SOLN
6.0000 mg | Freq: Once | INTRAVENOUS | Status: DC
  Filled 2016-10-24: qty 2

## 2016-10-24 MED ORDER — OXYCODONE-ACETAMINOPHEN 5-325 MG PO TABS: 2.0000 | ORAL_TABLET | Freq: Once | ORAL | Status: DC

## 2016-10-24 MED ORDER — MORPHINE SULFATE (PF) 4 MG/ML IV SOLN
8.0000 mg | Freq: Once | INTRAVENOUS | Status: AC
  Administered 2016-10-24: 8 mg via INTRAVENOUS

## 2016-10-24 MED ORDER — HYDROMORPHONE HCL 1 MG/ML IJ SOLN
1.0000 mg | Freq: Once | INTRAMUSCULAR | Status: AC
  Administered 2016-10-24: 1 mg via INTRAVENOUS
  Filled 2016-10-24: qty 1

## 2016-10-24 MED ORDER — HYDROMORPHONE HCL 1 MG/ML IJ SOLN: 1.0000 mg | Freq: Once | INTRAMUSCULAR | Status: DC

## 2016-10-24 MED ORDER — SODIUM CHLORIDE 0.9 % IV SOLN
Freq: Once | INTRAVENOUS | Status: AC
  Administered 2016-10-24: 22:00:00 via INTRAVENOUS

## 2016-10-24 MED ORDER — LORAZEPAM 2 MG/ML IJ SOLN
1.0000 mg | Freq: Once | INTRAMUSCULAR | Status: AC
  Administered 2016-10-24: 1 mg via INTRAVENOUS
  Filled 2016-10-24: qty 1

## 2016-10-24 MED ORDER — DIPHENHYDRAMINE HCL 50 MG/ML IJ SOLN
25.0000 mg | Freq: Once | INTRAMUSCULAR | Status: AC
  Administered 2016-10-24: 25 mg via INTRAVENOUS
  Filled 2016-10-24: qty 1

## 2016-10-24 MED ORDER — HYDROMORPHONE HCL 1 MG/ML IJ SOLN
1.0000 mg | Freq: Once | INTRAMUSCULAR | Status: AC
  Administered 2016-10-24: 1 mg via INTRAMUSCULAR
  Filled 2016-10-24: qty 1
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
## 2016-10-24 NOTE — ED Triage Notes (Signed)
Pt brought in by [facility] from home for fall on Monday. Pt is quadriplegic and fell out of his wheelchair on Monday. Pt states that over the past few days he has started having neck pain and right shoulder pain. Pt states that pain feels worse when he takes a deep breath in

## 2016-10-25 ENCOUNTER — Emergency Department: Payer: Medicare Other

## 2016-10-25 ENCOUNTER — Encounter: Payer: Self-pay | Admitting: Radiology

## 2016-10-25 DIAGNOSIS — M5412 Radiculopathy, cervical region: Secondary | ICD-10-CM | POA: Diagnosis not present

## 2016-10-25 MED ORDER — IOPAMIDOL (ISOVUE-370) INJECTION 76%
75.0000 mL | Freq: Once | INTRAVENOUS | Status: AC | PRN
  Administered 2016-10-25: 75 mL via INTRAVENOUS

## 2016-10-25 MED ORDER — OXYCODONE-ACETAMINOPHEN 10-325 MG PO TABS: 1.0000 | ORAL_TABLET | ORAL | 0 refills | Status: DC | PRN

## 2016-10-25 MED ORDER — OXYCODONE-ACETAMINOPHEN 5-325 MG PO TABS: 1.0000 | ORAL_TABLET | Freq: Four times a day (QID) | ORAL | 0 refills | Status: AC | PRN

## 2016-10-25 MED ORDER — OXYCODONE-ACETAMINOPHEN 5-325 MG PO TABS: 1.0000 | ORAL_TABLET | Freq: Four times a day (QID) | ORAL | 0 refills | Status: DC | PRN

## 2016-10-25 NOTE — ED Notes (Signed)
CT called to inquire about CT scan for the patient; CT scan staff states they will get the patient once they get done with another patient.

## 2016-10-25 NOTE — ED Notes (Signed)
MD available and informed of pt's request; MD in to follow up with pt

## 2016-10-25 NOTE — ED Notes (Signed)
Before leaving pt requested something more for pain; spoke with MD who did not give any new orders; pt informed;

## 2016-10-25 NOTE — ED Notes (Signed)
In to discharge pt home when sister/primary care giver asks if "anyone has looked in his throat"; she states one of pt's primary complaints was that pt is having difficulty swallowing his pills; pt says he has discomfort when he swallows anything, even water; pt would like MD to look in his mouth at his throat;

## 2016-10-25 NOTE — ED Provider Notes (Addendum)
-----------------------------------------   2:40 AM on 10/25/2016 -----------------------------------------  Patient's CT scans are resulted largely negative. Patient's workup is largely normal and is not clear the cause of the patient's discomfort at this time. We will discharge with a very short course of pain medications. I discussed strict return precautions with the patient. He is agreeable to this plan.   Minna Antis, MD 10/25/16 [phone]   patient is now requesting someone to look at his throat. States he has been having trouble swallowing pills because it feels like things are getting stuck in throat. Patient's oropharynx exam is normal. No erythema, no exudate. No swelling or edema. We will discharge the patient home with PCP follow-up.   Minna Antis, MD 10/25/16 [phone]

## 2016-10-29 LAB — CULTURE, BLOOD (ROUTINE X 2)
CULTURE: NO GROWTH
CULTURE: NO GROWTH

## 2016-11-02 ENCOUNTER — Other Ambulatory Visit (HOSPITAL_COMMUNITY): Payer: Self-pay | Admitting: Orthopedic Surgery

## 2016-11-02 DIAGNOSIS — M25511 Pain in right shoulder: Secondary | ICD-10-CM

## 2016-11-09 ENCOUNTER — Ambulatory Visit (HOSPITAL_COMMUNITY)
Admission: RE | Admit: 2016-11-09 | Discharge: 2016-11-09 | Disposition: A | Discharge status: Home / Self Care | Payer: Medicare Other | Source: Ambulatory Visit | Attending: Orthopedic Surgery | Admitting: Orthopedic Surgery

## 2016-11-09 DIAGNOSIS — M7581 Other shoulder lesions, right shoulder: Secondary | ICD-10-CM | POA: Diagnosis not present

## 2016-11-09 DIAGNOSIS — R531 Weakness: Secondary | ICD-10-CM | POA: Insufficient documentation

## 2016-11-09 DIAGNOSIS — M25511 Pain in right shoulder: Secondary | ICD-10-CM | POA: Diagnosis not present

## 2016-11-09 DIAGNOSIS — R937 Abnormal findings on diagnostic imaging of other parts of musculoskeletal system: Secondary | ICD-10-CM | POA: Diagnosis not present

## 2016-11-09 DIAGNOSIS — M7551 Bursitis of right shoulder: Secondary | ICD-10-CM | POA: Diagnosis not present

## 2016-11-11 ENCOUNTER — Ambulatory Visit: Payer: Medicare Other

## 2016-11-17 ENCOUNTER — Ambulatory Visit: Payer: Medicare Other

## 2016-11-20 ENCOUNTER — Ambulatory Visit (INDEPENDENT_AMBULATORY_CARE_PROVIDER_SITE_OTHER): Payer: Medicare Other | Admitting: Urology

## 2016-11-20 VITALS — BP 112/80 | HR 121

## 2016-11-20 DIAGNOSIS — R339 Retention of urine, unspecified: Secondary | ICD-10-CM

## 2016-11-20 DIAGNOSIS — N529 Male erectile dysfunction, unspecified: Secondary | ICD-10-CM | POA: Diagnosis not present
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
## 2016-12-08 NOTE — Progress Notes (Deleted)
12/09/2016 10:03 AM   Christian Harmon [birth year] [id number]  Referring provider: No referring provider defined for this encounter.  No chief complaint on file.   HPI: Patient is a 26 year old Caucasian with paraplegia, neurogenic bladder and erectile dysfunction who presents today for a Trimix titration.  Erectile dysfunction His SHIM score is ***, which is ***.   His previous SHIM score was ***.  He has been having difficulty with erections for ***.   His major complaint is ***.  His libido is ***.   His risk factors for ED are spinal injury and pain medication.  He denies any painful erections or curvatures with his erections.   He is still having/no longer having spontaneous erections.  He has tried *** in the past.       Score: 1-7 Severe ED 8-11 Moderate ED 12-16 Mild-Moderate ED 17-21 Mild ED 22-25 No ED     Reviewed referral notes.    PMH: Past Medical History:  Diagnosis Date  . 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 History of tethered spinal cord (HCC)   . Neuropathy   . Osteogenesis imperfecta   . Paraplegia (HCC) 05/30/2014  . Scoliosis   . Spinal cord injury, sacral (HCC) 06/25/14  . Traumatic fractures of multiple bones of hip and pelvis (HCC) 11/2011    Surgical History: Past Surgical History:  Procedure Laterality Date  . APPENDECTOMY    . EXCISION RADIAL HEAD Left 11/26/11  . FOREARM FASCIOTOMY Left    dorsal and volar  . JOINT REPLACEMENT Right    hip  . neurolysis posterior interosseus nerve Left   . osteoplasy - shortening of ulna L Left     Home Medications:  Allergies as of 12/09/2016      Reactions   Bee Venom Anaphylaxis   Ketamine Shortness Of Breath   Pt reports he stopped breathing and heart slowed, hospitalized for a week afterward   Ketorolac Hives, Other (See Comments)   Onion Anaphylaxis   Pt reported that he avoids raw and cooked forms of onions. Causes anaphylaxis. SS 04/08/2015   Other Hives   Tramadol Hives, Itching   Tape Rash   PLEASE USE PAPER TAPE!!      Medication List       Accurate as of 12/08/16 10:03 AM. Always use your most recent med list.          fentaNYL 100 MCG/HR Commonly known as:  DURAGESIC - dosed mcg/hr Place 100 mcg onto the skin every 3 (three) days.   gabapentin 400 MG capsule Commonly known as:  NEURONTIN Take 900 mg by mouth daily.   ibuprofen 800 MG tablet Commonly known as:  ADVIL,MOTRIN Take 800 mg by mouth every 8 (eight) hours as needed.   oxyCODONE-acetaminophen 5-325 MG tablet Commonly known as:  ROXICET Take 1 tablet by mouth every 6 (six) hours as needed.   pregabalin 150 MG capsule Commonly known as:  LYRICA Take 200 mg by mouth 3 (three) times daily.       Allergies:  Allergies  Allergen Reactions  . Bee Venom Anaphylaxis  . Ketamine Shortness Of Breath    Pt reports he stopped breathing and heart slowed, hospitalized for a week afterward  . Ketorolac Hives and Other (See Comments)  . Onion Anaphylaxis    Pt reported that he avoids raw and cooked forms of onions. Causes anaphylaxis. SS 04/08/2015  . Other Hives  . Tramadol Hives and Itching  .  Tape Rash    PLEASE USE PAPER TAPE!!    Family History: Family History  Problem Relation Age of Onset  . Prostate cancer Neg Hx   . Kidney cancer Neg Hx   . Bladder Cancer Neg Hx     Social History:  reports that he has never smoked. He uses smokeless tobacco. He reports that he does not drink alcohol or use drugs.  ROS:                                        Physical Exam: There were no vitals taken for this visit.  Constitutional: Well nourished. Alert and oriented, No acute distress. HEENT: [place] AT, moist mucus membranes. Trachea midline, no masses. Cardiovascular: No clubbing, cyanosis, or edema. Respiratory: Normal respiratory effort, no increased work of breathing. GI: Abdomen is soft, non tender, non distended, no abdominal masses.  Liver and spleen not palpable.  No hernias appreciated.  Stool sample for occult testing is not indicated.   GU: No CVA tenderness.  No bladder fullness or masses.  Patient with circumcised/uncircumcised phallus. ***Foreskin easily retracted***  Urethral meatus is patent.  No penile discharge. No penile lesions or rashes. Scrotum without lesions, cysts, rashes and/or edema.  Testicles are located scrotally bilaterally. No masses are appreciated in the testicles. Left and right epididymis are normal. Rectal: Patient with  normal sphincter tone. Anus and perineum without scarring or rashes. No rectal masses are appreciated. Prostate is approximately *** grams, *** nodules are appreciated. Seminal vesicles are normal. Skin: No rashes, bruises or suspicious lesions. Lymph: No cervical or inguinal adenopathy. Neurologic: Grossly intact, no focal deficits, moving all 4 extremities. Psychiatric: Normal mood and affect.  Laboratory Data: Lab Results  Component Value Date   WBC 8.3 10/24/2016   HGB 11.4 (L) 10/24/2016   HCT 33.8 (L) 10/24/2016   MCV 81.9 10/24/2016   PLT 226 10/24/2016    Lab Results  Component Value Date   CREATININE 0.61 10/24/2016    Lab Results  Component Value Date   AST 23 10/24/2016   Lab Results  Component Value Date   ALT 30 10/24/2016    Urinalysis    Component Value Date/Time   COLORURINE YELLOW (A) 10/24/2016 2101   APPEARANCEUR CLEAR (A) 10/24/2016 2101   APPEARANCEUR Hazy (A) 01/21/2016 1501   LABSPEC 1.025 10/24/2016 2101   PHURINE 6.0 10/24/2016 2101   GLUCOSEU NEGATIVE 10/24/2016 2101   HGBUR NEGATIVE 10/24/2016 2101   BILIRUBINUR NEGATIVE 10/24/2016 2101   BILIRUBINUR Negative 01/21/2016 1501   KETONESUR NEGATIVE 10/24/2016 2101   PROTEINUR NEGATIVE 10/24/2016 2101   NITRITE NEGATIVE 10/24/2016 2101   LEUKOCYTESUR NEGATIVE 10/24/2016 2101   LEUKOCYTESUR Negative 01/21/2016 1501    I have reviewed the labs.   Procedure Patient's left  corpus cavernosum is identified.  An area near the base of the penis is cleansed with rubbing alcohol.  Careful to avoid the dorsal vein, 2 mcg of Trimix (papaverine 30 mg, phentolamine 1 mg and prostaglandin E1 10 mcg, Lot # *** exp # ***) is injected at a 90 degree angle into the left corpus cavernosum near the base of the penis.  Patient experienced a very firm erection in 15 minutes.    Assessment & Plan:    Erectile dysfunction  - SHIM score is ***  - I explained to the patient that in order to achieve an erection it  takes good functioning of the nervous system (parasympathetic and rs, sympathetic, sensory and motor), good blood flow into the erectile tissue of the penis and a desire to have sex  - I explained that conditions like diabetes, hypertension, coronary artery disease, peripheral vascular disease, smoking, alcohol consumption, age, sleep apnea and BPH can diminish the ability to have an erection  - we will obtain a serum testosterone level at this time; if it is abnormal we will need to repeat the study for confirmation and TSH as per AUA guidelines  - failed PDE5i  - **** of Trimix injected  - Advised patient of the condition of priapism, painful erection lasting for more than four hours, and to contact the office immediately or seek treatment in the ED  - RTC pending lab results   No Follow-up on file.  These notes generated with voice recognition software. I apologize for typographical errors.  Michiel Cowboy, PA-C  [facility] [address] [place], [place] [address] [phone]

## 2016-12-09 ENCOUNTER — Ambulatory Visit: Payer: Medicare Other | Admitting: Urology

## 2016-12-28 ENCOUNTER — Telehealth: Payer: Self-pay | Admitting: Urology

## 2016-12-28 ENCOUNTER — Encounter: Payer: Self-pay | Admitting: Urology

## 2016-12-28 ENCOUNTER — Other Ambulatory Visit: Payer: Self-pay | Admitting: Urology

## 2016-12-28 ENCOUNTER — Ambulatory Visit (INDEPENDENT_AMBULATORY_CARE_PROVIDER_SITE_OTHER): Payer: Medicare Other | Admitting: Urology

## 2016-12-28 VITALS — BP 133/86 | HR 98 | Temp 98.2°F | Ht 60.0 in | Wt 120.0 lb

## 2016-12-28 DIAGNOSIS — N4889 Other specified disorders of penis: Secondary | ICD-10-CM

## 2016-12-28 MED ORDER — CEPHALEXIN 500 MG PO CAPS: 500.0000 mg | ORAL_CAPSULE | Freq: Two times a day (BID) | ORAL | 0 refills | Status: AC

## 2016-12-28 MED ORDER — CEFTRIAXONE SODIUM 1 G IJ SOLR
1.0000 g | INTRAMUSCULAR | Status: AC
  Administered 2016-12-28: 1 g via INTRAMUSCULAR

## 2016-12-28 NOTE — Telephone Encounter (Signed)
We need an urine on him for UA and culture.

## 2016-12-28 NOTE — Progress Notes (Addendum)
12/28/2016 5:00 PM   Christian Harmon 1990/10/20 [id number]  Referring provider: No referring provider defined for this encounter.  Chief Complaint  Patient presents with  . Penis Pain    HPI: Patient is a 26 year old Caucasian with paraplegia, neurogenic bladder and erectile dysfunction who presents today for penile pain.    He states he woke up this am with penile pain and swelling.  He states that the base of his penis was extremely swollen along with the base of the penis.  He was unable to CIC due to the swelling and pain until 1 pm this afternoon.    He took a Benadryl and the swelling retreated a fair bit.   He has not had any fevers, chills, nausea or vomiting.  He has not had dysuria, penile discharge or gross hematuria.  He is not having suprapubic pain.    He denies any trauma to the area.    PMH: Past Medical History:  Diagnosis Date  . 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 History of tethered spinal cord   . Neuropathy   . Osteogenesis imperfecta   . Paraplegia (HCC) 05/30/2014  . Scoliosis   . Spinal cord injury, sacral (HCC) 06/25/14  . Traumatic fractures of multiple bones of hip and pelvis (HCC) 11/2011    Surgical History: Past Surgical History:  Procedure Laterality Date  . APPENDECTOMY    . EXCISION RADIAL HEAD Left 11/26/11  . FOREARM FASCIOTOMY Left    dorsal and volar  . JOINT REPLACEMENT Right    hip  . neurolysis posterior interosseus nerve Left   . osteoplasy - shortening of ulna L Left     Home Medications:  Allergies as of 12/28/2016      Reactions   Bee Venom Anaphylaxis   Ketamine Shortness Of Breath   Pt reports he stopped breathing and heart slowed, hospitalized for a week afterward   Ketorolac Hives, Other (See Comments)   Onion Anaphylaxis   Pt reported that he avoids raw and cooked forms of onions. Causes anaphylaxis. SS 04/08/2015   Other Hives   Tramadol Hives, Itching   Tape Rash   PLEASE USE PAPER  TAPE!!      Medication List        Accurate as of 12/28/16  5:00 PM. Always use your most recent med list.          cephALEXin 500 MG capsule Commonly known as:  KEFLEX Take 1 capsule (500 mg total) 2 (two) times daily by mouth.   fentaNYL 100 MCG/HR Commonly known as:  DURAGESIC - dosed mcg/hr Place 100 mcg onto the skin every 3 (three) days.   gabapentin 400 MG capsule Commonly known as:  NEURONTIN Take 900 mg by mouth daily.   ibuprofen 800 MG tablet Commonly known as:  ADVIL,MOTRIN Take 800 mg by mouth every 8 (eight) hours as needed.   oxyCODONE-acetaminophen 5-325 MG tablet Commonly known as:  ROXICET Take 1 tablet by mouth every 6 (six) hours as needed.   pregabalin 150 MG capsule Commonly known as:  LYRICA Take 200 mg by mouth 3 (three) times daily.       Allergies:  Allergies  Allergen Reactions  . Bee Venom Anaphylaxis  . Ketamine Shortness Of Breath    Pt reports he stopped breathing and heart slowed, hospitalized for a week afterward  . Ketorolac Hives and Other (See Comments)  . Onion Anaphylaxis    Pt reported that he  avoids raw and cooked forms of onions. Causes anaphylaxis. SS 04/08/2015  . Other Hives  . Tramadol Hives and Itching  . Tape Rash    PLEASE USE PAPER TAPE!!    Family History: Family History  Problem Relation Age of Onset  . Prostate cancer Neg Hx   . Kidney cancer Neg Hx   . Bladder Cancer Neg Hx     Social History:  reports that  has never smoked. He uses smokeless tobacco. He reports that he does not drink alcohol or use drugs.  ROS: UROLOGY Frequent Urination?: No Hard to postpone urination?: No Burning/pain with urination?: No Get up at night to urinate?: No Leakage of urine?: No Urine stream starts and stops?: No Trouble starting stream?: No Do you have to strain to urinate?: No Blood in urine?: No Urinary tract infection?: No Sexually transmitted disease?: No Injury to kidneys or bladder?: No Painful  intercourse?: No Weak stream?: No Erection problems?: No Penile pain?: No  Gastrointestinal Nausea?: No Vomiting?: No Indigestion/heartburn?: No Diarrhea?: No Constipation?: No  Constitutional Fever: No Night sweats?: No Weight loss?: No Fatigue?: No  Skin Skin rash/lesions?: No Itching?: No  Eyes Blurred vision?: No Double vision?: No  Ears/Nose/Throat Sore throat?: No Sinus problems?: No  Hematologic/Lymphatic Swollen glands?: No Easy bruising?: No  Cardiovascular Leg swelling?: No Chest pain?: No  Respiratory Cough?: No Shortness of breath?: No  Endocrine Excessive thirst?: No  Musculoskeletal Back pain?: No Joint pain?: No  Neurological Headaches?: No Dizziness?: No  Psychologic Depression?: No Anxiety?: No  Physical Exam: BP 133/86   Pulse 98   Temp 98.2 F (36.8 C)   Ht 5' (1.524 m)   Wt 120 lb (54.4 kg)   BMI 23.44 kg/m   Constitutional: Well nourished. Alert and oriented, No acute distress. HEENT: [place] AT, moist mucus membranes. Trachea midline, no masses. Cardiovascular: No clubbing, cyanosis, or edema. Respiratory: Normal respiratory effort, no increased work of breathing. GI: Abdomen is soft, non tender, non distended, no abdominal masses. Liver and spleen not palpable.  No hernias appreciated.  Stool sample for occult testing is not indicated.   GU: No CVA tenderness.  No bladder fullness or masses.  Patient with circumcised phallus.   Urethral meatus is patent.  No penile discharge. No penile lesions or rashes. Penile shaft is red and very tender to palpation.  There is mild edema around the base of the penis and on the ventral side near the glans.  Scrotum without lesions, cysts, rashes and/or edema.  Testicles are located scrotally bilaterally. No masses are appreciated in the testicles. Left and right epididymis are normal.(examined with Dr. Mena GoesEskridge) Skin: No rashes, bruises or suspicious lesions. Lymph: No cervical or inguinal  adenopathy. Neurologic: Grossly intact, no focal deficits, moving all 4 extremities. Psychiatric: Normal mood and affect.  Laboratory Data: Lab Results  Component Value Date   WBC 8.3 10/24/2016   HGB 11.4 (L) 10/24/2016   HCT 33.8 (L) 10/24/2016   MCV 81.9 10/24/2016   PLT 226 10/24/2016    Lab Results  Component Value Date   CREATININE 0.61 10/24/2016    Lab Results  Component Value Date   AST 23 10/24/2016   Lab Results  Component Value Date   ALT 30 10/24/2016   I have reviewed the labs.    Assessment & Plan:    1. Penile pain  - ? Early stage cellulitis  - given Rocephin 1 gram IM in the office  - script for Keflex 500  mg bid for 7 days sent to pharmacy  - UA needed - will call patient tomorrow for UA and culture specimen  - Advised to contact our office or seek treatment in the ED if penis continue to swell, the redness travels beyond his penis, if becomes febrile or pain/ vomiting are difficult control in order to arrange for emergent/urgent intervention  Return in about 1 week (around 01/04/2017) for recheck.  These notes generated with voice recognition software. I apologize for typographical errors.  Michiel CowboySHANNON Nasean Zapf, PA-C  [facility] [address], [address] [place], [place] [address] [phone]

## 2016-12-28 NOTE — Progress Notes (Signed)
IM Injection  Patient is present today for an IM Injection for treatment of penis infection Drug: rocephin Dose:1g Location:right outter quadrant  Lot: [id number] Exp:04/2019 Patient tolerated well, no complications were noted  Preformed by: Rupert Stackshelsea Sherri Levenhagen, LPN

## 2016-12-29 NOTE — Telephone Encounter (Signed)
Pt needs to f/u in 2 weeks also.

## 2016-12-29 NOTE — Telephone Encounter (Signed)
LMOM

## 2017-01-04 NOTE — Telephone Encounter (Signed)
LMOM

## 2017-01-06 NOTE — Telephone Encounter (Signed)
Will send a letter about needing an OV and u/a and cx.

## 2017-01-11 MED ORDER — POLYETHYLENE GLYCOL 3350 17 G PO PACK
17.00 | PACK | ORAL | Status: DC
Start: 2017-01-11 — End: 2017-01-11

## 2017-01-11 MED ORDER — DOCUSATE SODIUM 100 MG PO CAPS
100.00 mg | ORAL_CAPSULE | ORAL | Status: DC
Start: 2017-01-12 — End: 2017-01-11

## 2017-01-11 MED ORDER — TIZANIDINE HCL 4 MG PO TABS
4.00 mg | ORAL_TABLET | ORAL | Status: DC
Start: ? — End: 2017-01-11

## 2017-01-11 MED ORDER — OXYCODONE HCL 5 MG PO TABS
10.00 mg | ORAL_TABLET | ORAL | Status: DC
Start: ? — End: 2017-01-11

## 2017-01-11 MED ORDER — BUSPIRONE HCL 10 MG PO TABS
30.00 mg | ORAL_TABLET | ORAL | Status: DC
Start: 2017-01-12 — End: 2017-01-11

## 2017-01-11 MED ORDER — SERTRALINE HCL 100 MG PO TABS
200.00 mg | ORAL_TABLET | ORAL | Status: DC
Start: 2017-01-12 — End: 2017-01-11

## 2017-01-11 MED ORDER — PREGABALIN 75 MG PO CAPS
150.00 mg | ORAL_CAPSULE | ORAL | Status: DC
Start: 2017-01-12 — End: 2017-01-11

## 2017-01-11 MED ORDER — ENOXAPARIN SODIUM 40 MG/0.4ML ~~LOC~~ SOLN
40.00 mg | SUBCUTANEOUS | Status: DC
Start: 2017-01-11 — End: 2017-01-11

## 2017-01-11 MED ORDER — SENNOSIDES 8.6 MG PO TABS
2.00 | ORAL_TABLET | ORAL | Status: DC
Start: 2017-01-11 — End: 2017-01-11

## 2017-01-11 MED ORDER — AMITRIPTYLINE HCL 25 MG PO TABS
25.00 mg | ORAL_TABLET | ORAL | Status: DC
Start: 2017-01-11 — End: 2017-01-11

## 2017-01-11 MED ORDER — BISACODYL 10 MG RE SUPP
10.00 mg | RECTAL | Status: DC
Start: 2017-01-12 — End: 2017-01-11

## 2017-01-12 NOTE — Progress Notes (Deleted)
01/13/2017 12:51 PM   Christian JewNicholas Harmon 09-21-1990 [id number]  Referring provider: No referring provider defined for this encounter.  No chief complaint on file.   HPI: Patient is a 26 year old Caucasian with paraplegia, neurogenic bladder and erectile dysfunction who presents today for a 2 week follow up.    Background history He states he woke up this am with penile pain and swelling.  He states that the base of his penis was extremely swollen along with the base of the penis.  He was unable to CIC due to the swelling and pain until 1 pm this afternoon.    He took a Benadryl and the swelling retreated a fair bit.   He has not had any fevers, chills, nausea or vomiting.  He has not had dysuria, penile discharge or gross hematuria.  He is not having suprapubic pain.  He denies any trauma to the area.   Instead of following up with us, [facility].  He stated that he was instructed to be seen there to receive IV antibiotics for an UTI.  There is no record of this order been given.  In fact, he never returned to give an urine sample for culture.  In fact, he gave [facility] misleading information concerning his past medical history.  The nursing staff also observed him manipulating his catheter in an effort to produce penile swelling.    Today, he ***.        PMH: Past Medical History:  Diagnosis Date  . 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 History of tethered spinal cord   . Neuropathy   . Osteogenesis imperfecta   . Paraplegia (HCC) 05/30/2014  . Scoliosis   . Spinal cord injury, sacral (HCC) 06/25/14  . Traumatic fractures of multiple bones of hip and pelvis (HCC) 11/2011    Surgical History: Past Surgical History:  Procedure Laterality Date  . APPENDECTOMY    . EXCISION RADIAL HEAD Left 11/26/11  . FOREARM FASCIOTOMY Left    dorsal and volar  . JOINT REPLACEMENT Right    hip  . neurolysis posterior interosseus nerve Left   .  osteoplasy - shortening of ulna L Left     Home Medications:  Allergies as of 01/13/2017      Reactions   Bee Venom Anaphylaxis   Ketamine Shortness Of Breath   Pt reports he stopped breathing and heart slowed, hospitalized for a week afterward   Ketorolac Hives, Other (See Comments)   Onion Anaphylaxis   Pt reported that he avoids raw and cooked forms of onions. Causes anaphylaxis. SS 04/08/2015   Other Hives   Tramadol Hives, Itching   Tape Rash   PLEASE USE PAPER TAPE!!      Medication List        Accurate as of 01/12/17 12:51 PM. Always use your most recent med list.          cephALEXin 500 MG capsule Commonly known as:  KEFLEX Take 1 capsule (500 mg total) 2 (two) times daily by mouth.   fentaNYL 100 MCG/HR Commonly known as:  DURAGESIC - dosed mcg/hr Place 100 mcg onto the skin every 3 (three) days.   gabapentin 400 MG capsule Commonly known as:  NEURONTIN Take 900 mg by mouth daily.   ibuprofen 800 MG tablet Commonly known as:  ADVIL,MOTRIN Take 800 mg by mouth every 8 (eight) hours as needed.   oxyCODONE-acetaminophen 5-325 MG tablet  Commonly known as:  ROXICET Take 1 tablet by mouth every 6 (six) hours as needed.   pregabalin 150 MG capsule Commonly known as:  LYRICA Take 200 mg by mouth 3 (three) times daily.       Allergies:  Allergies  Allergen Reactions  . Bee Venom Anaphylaxis  . Ketamine Shortness Of Breath    Pt reports he stopped breathing and heart slowed, hospitalized for a week afterward  . Ketorolac Hives and Other (See Comments)  . Onion Anaphylaxis    Pt reported that he avoids raw and cooked forms of onions. Causes anaphylaxis. SS 04/08/2015  . Other Hives  . Tramadol Hives and Itching  . Tape Rash    PLEASE USE PAPER TAPE!!    Family History: Family History  Problem Relation Age of Onset  . Prostate cancer Neg Hx   . Kidney cancer Neg Hx   . Bladder Cancer Neg Hx     Social History:  reports that  has never smoked. He  uses smokeless tobacco. He reports that he does not drink alcohol or use drugs.  ROS:                                        Physical Exam: There were no vitals taken for this visit.  Constitutional: Well nourished. Alert and oriented, No acute distress. HEENT: [place] AT, moist mucus membranes. Trachea midline, no masses. Cardiovascular: No clubbing, cyanosis, or edema. Respiratory: Normal respiratory effort, no increased work of breathing. GI: Abdomen is soft, non tender, non distended, no abdominal masses. Liver and spleen not palpable.  No hernias appreciated.  Stool sample for occult testing is not indicated.   GU: No CVA tenderness.  No bladder fullness or masses.  Patient with circumcised phallus.   Urethral meatus is patent.  No penile discharge. No penile lesions or rashes. Penile shaft is red and very tender to palpation.  There is mild edema around the base of the penis and on the ventral side near the glans.  Scrotum without lesions, cysts, rashes and/or edema.  Testicles are located scrotally bilaterally. No masses are appreciated in the testicles. Left and right epididymis are normal.(examined with Dr. Mena Goes) Skin: No rashes, bruises or suspicious lesions. Lymph: No cervical or inguinal adenopathy. Neurologic: Grossly intact, no focal deficits, moving all 4 extremities. Psychiatric: Normal mood and affect.  Laboratory Data: Lab Results  Component Value Date   WBC 8.3 10/24/2016   HGB 11.4 (L) 10/24/2016   HCT 33.8 (L) 10/24/2016   MCV 81.9 10/24/2016   PLT 226 10/24/2016    Lab Results  Component Value Date   CREATININE 0.61 10/24/2016    Lab Results  Component Value Date   AST 23 10/24/2016   Lab Results  Component Value Date   ALT 30 10/24/2016   I have reviewed the labs.  Pertinent Imaging This result has an attachment that is not available.  Result Narrative  Limited Renal Ulrasound ([id number])  Indication: A focused  ultrasound of the kidneys was performed to evaluate for  hydronephrosis and nephrolithiasis. The ultrasound was performed with the  following indications, as noted in the H&P: Abdominal or flank pain  Identified structures: Right kidney, Left kidney and Bladder  Findings: Exam of the above structures revealed the following findings:   Hydronephrosis: Absent If present: N/A If present: N/A  Intrarenal Calculi: Absent If present: N/A  Ureteral  Calculi: Absent  If present: N/A  Vesicular Calculi: Absent  Other findings: thickened bladder wall  Limitations: none   Impression:   Normal limited renal ultrasound, no evidence of hydronephrosis or calculi  Other: thickened bladder wall  Interpreted by: Ulice DashLauren Q Massimo, MD Quality Assurance  After review of the point-of-care ultrasound performed in this case I  assess the overall image quality as: Image quality: Minimally recognizable  structures but insufficient for diagnosis  The accuracy of interpretation of images as presented reflects a  technically limited study.  This study does not meet minimum criteria for credentialing and billing.   Shauna HughSarah A Stahmer, MD  Status   Result Impression  No acute findings in the abdomen or pelvis.  Result Narrative  EXAM: CT abdomen and pelvis with contrast DATE: 01/07/2017 3:24 PM ACCESSION: [id number] UN DICTATED: 01/07/2017 3:31 PM INTERPRETATION LOCATION: [facility]  CLINICAL INDICATION: 26 years old Male with ABDOMINAL PAIN, (specify site in comments)-diffusely-  COMPARISON: Multiple prior studies, including CT abdomen pelvis performed 05/22/2016  TECHNIQUE: A spiral CT scan was obtained with IV contrast from the lung bases to the pubic symphysis.Images were reconstructed in the axial plane. Coronal and sagittal reformatted images were also provided for further evaluation.  FINDINGS:  LOWER CHEST: No significant abnormality. HEPATOBILIARY: No concerning liver  lesions. A few subcentimeter hypodensities, too small to characterize but similar to prior. No biliary ductal dilatation. PANCREAS: Unremarkable. SPLEEN: No splenomegaly. ADRENAL GLANDS:No nodules. KIDNEYS/URETERS:No solid renal masses, hydronephrosis, or urinary tract calculi. PELVIC ORGANS: Partially obscured by metallic streak artifact. Grossly unremarkable. BOWEL: No obstruction. Post appendectomy per Epic EMR. PERITONEUM/RETROPERITONEUM: No fluid collection or intraperitoneal free air. VASCULATURE: Unremarkable for age. No abdominal aortic aneurysm. LYMPH NODES: No enlarged lymph nodes in the abdomen or pelvis. 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 Posterior lumbar fusion hardware. Total right hip arthroplasty. Unchanged 3 cm right inguinal fluid collection.  Other Result Information    Assessment & Plan:    1. Penile pain  - ? Early stage cellulitis  - given Rocephin 1 gram IM in the office  - script for Keflex 500 mg bid for 7 days sent to pharmacy  - UA needed - will call patient tomorrow for UA and culture specimen  - Advised to contact our office or seek treatment in the ED if penis continue to swell, the redness travels beyond his penis, if becomes febrile or pain/ vomiting are difficult control in order to arrange for emergent/urgent intervention  No Follow-up on file.  These notes generated with voice recognition software. I apologize for typographical errors.  Michiel CowboySHANNON Casy Brunetto, PA-C  [facility] [address], [address] [place], [place] [address] [phone]

## 2017-01-13 ENCOUNTER — Ambulatory Visit: Payer: Self-pay | Admitting: Urology

## 2017-01-13 ENCOUNTER — Encounter: Payer: Self-pay | Admitting: Urology

## 2017-02-08 ENCOUNTER — Emergency Department: Payer: Medicare Other

## 2017-02-08 ENCOUNTER — Other Ambulatory Visit: Payer: Self-pay

## 2017-02-08 ENCOUNTER — Encounter: Payer: Self-pay | Admitting: Radiology

## 2017-02-08 ENCOUNTER — Emergency Department
Admission: EM | Admit: 2017-02-08 | Discharge: 2017-02-08 | Disposition: A | Discharge status: Home / Self Care | Payer: Medicare Other | Attending: Emergency Medicine | Admitting: Emergency Medicine

## 2017-02-08 DIAGNOSIS — R1032 Left lower quadrant pain: Secondary | ICD-10-CM | POA: Diagnosis not present

## 2017-02-08 DIAGNOSIS — G8929 Other chronic pain: Secondary | ICD-10-CM | POA: Diagnosis not present

## 2017-02-08 DIAGNOSIS — R109 Unspecified abdominal pain: Secondary | ICD-10-CM

## 2017-02-08 DIAGNOSIS — F17220 Nicotine dependence, chewing tobacco, uncomplicated: Secondary | ICD-10-CM | POA: Insufficient documentation

## 2017-02-08 DIAGNOSIS — R197 Diarrhea, unspecified: Secondary | ICD-10-CM | POA: Diagnosis not present

## 2017-02-08 DIAGNOSIS — R112 Nausea with vomiting, unspecified: Secondary | ICD-10-CM | POA: Diagnosis not present

## 2017-02-08 DIAGNOSIS — Z79899 Other long term (current) drug therapy: Secondary | ICD-10-CM | POA: Insufficient documentation

## 2017-02-08 LAB — CBC WITH DIFFERENTIAL/PLATELET
Basophils Absolute: 0 10*3/uL (ref 0–0.1)
Basophils Relative: 1 %
EOS ABS: 0.1 10*3/uL (ref 0–0.7)
Eosinophils Relative: 2 %
HCT: 41.4 % (ref 40.0–52.0)
HEMOGLOBIN: 14 g/dL (ref 13.0–18.0)
LYMPHS ABS: 1.2 10*3/uL (ref 1.0–3.6)
Lymphocytes Relative: 29 %
MCH: 27.7 pg (ref 26.0–34.0)
MCHC: 33.9 g/dL (ref 32.0–36.0)
MCV: 81.7 fL (ref 80.0–100.0)
MONO ABS: 0.4 10*3/uL (ref 0.2–1.0)
MONOS PCT: 10 %
NEUTROS PCT: 58 %
Neutro Abs: 2.3 10*3/uL (ref 1.4–6.5)
Platelets: 203 10*3/uL (ref 150–440)
RBC: 5.07 MIL/uL (ref 4.40–5.90)
RDW: 22.6 % — AB (ref 11.5–14.5)
WBC: 4 10*3/uL (ref 3.8–10.6)

## 2017-02-08 LAB — URINALYSIS, COMPLETE (UACMP) WITH MICROSCOPIC
BACTERIA UA: NONE SEEN
BILIRUBIN URINE: NEGATIVE
Glucose, UA: NEGATIVE mg/dL
Hgb urine dipstick: NEGATIVE
KETONES UR: NEGATIVE mg/dL
LEUKOCYTES UA: NEGATIVE
Nitrite: NEGATIVE
Protein, ur: NEGATIVE mg/dL
RBC / HPF: NONE SEEN RBC/hpf (ref 0–5)
SPECIFIC GRAVITY, URINE: 1.035 — AB (ref 1.005–1.030)
SQUAMOUS EPITHELIAL / LPF: NONE SEEN
pH: 7 (ref 5.0–8.0)

## 2017-02-08 LAB — COMPREHENSIVE METABOLIC PANEL
ALBUMIN: 4.6 g/dL (ref 3.5–5.0)
ALK PHOS: 61 U/L (ref 38–126)
ALT: 15 U/L — AB (ref 17–63)
AST: 23 U/L (ref 15–41)
Anion gap: 10 (ref 5–15)
BILIRUBIN TOTAL: 0.8 mg/dL (ref 0.3–1.2)
BUN: 16 mg/dL (ref 6–20)
CO2: 24 mmol/L (ref 22–32)
CREATININE: 0.7 mg/dL (ref 0.61–1.24)
Calcium: 8.4 mg/dL — ABNORMAL LOW (ref 8.9–10.3)
Chloride: 105 mmol/L (ref 101–111)
GFR calc Af Amer: >60 mL/min (ref >60)
GFR calc non Af Amer: >60 mL/min (ref >60)
GLUCOSE: 93 mg/dL (ref 65–99)
Potassium: 3.4 mmol/L — ABNORMAL LOW (ref 3.5–5.1)
SODIUM: 139 mmol/L (ref 135–145)
Total Protein: 7.6 g/dL (ref 6.5–8.1)

## 2017-02-08 LAB — LACTIC ACID, PLASMA: Lactic Acid, Venous: 1.5 mmol/L (ref 0.5–1.9)

## 2017-02-08 LAB — PROTIME-INR
INR: 1.01
PROTHROMBIN TIME: 13.2 s (ref 11.4–15.2)

## 2017-02-08 MED ORDER — MORPHINE SULFATE (PF) 2 MG/ML IV SOLN
2.0000 mg | Freq: Once | INTRAVENOUS | Status: AC
  Administered 2017-02-08: 2 mg via INTRAVENOUS
  Filled 2017-02-08: qty 1

## 2017-02-08 MED ORDER — IOPAMIDOL (ISOVUE-300) INJECTION 61%
100.0000 mL | Freq: Once | INTRAVENOUS | Status: AC | PRN
  Administered 2017-02-08: 100 mL via INTRAVENOUS

## 2017-02-08 MED ORDER — SODIUM CHLORIDE 0.9 % IV BOLUS (SEPSIS)
500.0000 mL | Freq: Once | INTRAVENOUS | Status: AC
  Administered 2017-02-08: 500 mL via INTRAVENOUS

## 2017-02-08 MED ORDER — ONDANSETRON HCL 4 MG/2ML IJ SOLN
4.0000 mg | Freq: Once | INTRAMUSCULAR | Status: AC
  Administered 2017-02-08: 4 mg via INTRAVENOUS
  Filled 2017-02-08: qty 2

## 2017-02-08 NOTE — ED Triage Notes (Addendum)
Pt here with mom, mother reports pt has been intermittently sick since having surgery due to trauma from a foley insertion. Mother reports [facility], reports giving antibiotics for UTI and being sent home. Pt reports lethargy, swollen penis, blood in urine. [facility] and given IV antibiotics for UTI and told he had an infection in his chest and sent home. Mother insistent patient has sepsis. Pt has been vomiting and is unable to take his meds. Reports generalized abdominal pain.
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
## 2017-02-08 NOTE — Discharge Instructions (Signed)
? ?  Please return to the emergency room right away if you are to develop a fever, severe nausea, your pain becomes severe or worsens, you are unable to keep food down, begin vomiting any dark or bloody fluid, you develop any dark or bloody stools, feel dehydrated, or other new concerns or symptoms arise. ? ?

## 2017-02-08 NOTE — ED Notes (Signed)
Pt given in and out kit to perform self cath.

## 2017-02-08 NOTE — ED Notes (Signed)
Pt states that mom has questions for MD and doesn't want to discuss DC information until mother is here.
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
## 2017-02-12 NOTE — Telephone Encounter (Signed)
ERROR

## 2017-02-13 LAB — CULTURE, BLOOD (ROUTINE X 2)
Culture: NO GROWTH
Culture: NO GROWTH
SPECIMEN DESCRIPTION: ADEQUATE
Specimen Description: ADEQUATE

## 2017-03-20 ENCOUNTER — Encounter: Payer: Self-pay | Admitting: *Deleted

## 2017-03-20 ENCOUNTER — Emergency Department: Payer: Medicare Other

## 2017-03-20 ENCOUNTER — Other Ambulatory Visit: Payer: Self-pay

## 2017-03-20 ENCOUNTER — Emergency Department
Admission: EM | Admit: 2017-03-20 | Discharge: 2017-03-20 | Disposition: A | Discharge status: Home / Self Care | Payer: Medicare Other | Attending: Emergency Medicine | Admitting: Emergency Medicine

## 2017-03-20 DIAGNOSIS — G8250 Quadriplegia, unspecified: Secondary | ICD-10-CM | POA: Insufficient documentation

## 2017-03-20 DIAGNOSIS — W231XXA Caught, crushed, jammed, or pinched between stationary objects, initial encounter: Secondary | ICD-10-CM | POA: Diagnosis not present

## 2017-03-20 DIAGNOSIS — Y998 Other external cause status: Secondary | ICD-10-CM | POA: Insufficient documentation

## 2017-03-20 DIAGNOSIS — Y929 Unspecified place or not applicable: Secondary | ICD-10-CM | POA: Diagnosis not present

## 2017-03-20 DIAGNOSIS — Y9389 Activity, other specified: Secondary | ICD-10-CM | POA: Insufficient documentation

## 2017-03-20 DIAGNOSIS — Z79899 Other long term (current) drug therapy: Secondary | ICD-10-CM | POA: Diagnosis not present

## 2017-03-20 DIAGNOSIS — S60221A Contusion of right hand, initial encounter: Secondary | ICD-10-CM | POA: Diagnosis not present

## 2017-03-20 DIAGNOSIS — S6991XA Unspecified injury of right wrist, hand and finger(s), initial encounter: Secondary | ICD-10-CM | POA: Diagnosis present

## 2017-03-20 MED ORDER — PREDNISONE 50 MG PO TABS: 50.0000 mg | ORAL_TABLET | Freq: Every day | ORAL | 0 refills | Status: AC

## 2017-03-20 MED ORDER — HYDROMORPHONE HCL 1 MG/ML IJ SOLN
INTRAMUSCULAR | Status: AC
  Filled 2017-03-20: qty 1

## 2017-03-20 MED ORDER — ETODOLAC 500 MG PO TABS: 500.0000 mg | ORAL_TABLET | Freq: Two times a day (BID) | ORAL | 0 refills | Status: AC

## 2017-03-20 MED ORDER — FENTANYL CITRATE (PF) 100 MCG/2ML IJ SOLN
100.0000 ug | Freq: Once | INTRAMUSCULAR | Status: DC
  Filled 2017-03-20: qty 2

## 2017-03-20 MED ORDER — ONDANSETRON 8 MG PO TBDP
8.0000 mg | ORAL_TABLET | Freq: Once | ORAL | Status: AC
  Administered 2017-03-20: 8 mg via ORAL
  Filled 2017-03-20: qty 1

## 2017-03-20 MED ORDER — DEXAMETHASONE SODIUM PHOSPHATE 10 MG/ML IJ SOLN
10.0000 mg | Freq: Once | INTRAMUSCULAR | Status: AC
  Administered 2017-03-20: 10 mg via INTRAMUSCULAR
  Filled 2017-03-20: qty 1

## 2017-03-20 MED ORDER — HYDROMORPHONE HCL 1 MG/ML IJ SOLN
1.0000 mg | Freq: Once | INTRAMUSCULAR | Status: AC
  Administered 2017-03-20: 1 mg via INTRAMUSCULAR
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
## 2017-03-20 NOTE — ED Triage Notes (Signed)
Pt to ED after having closed a trailer door on right hand. Pt has swelling noted to right hand and reports he is unable to move the middle three fingers. Color is approriate no deformity noted. Pt reports he has been icing hand for the past hour.

## 2017-03-21 ENCOUNTER — Emergency Department
Admission: EM | Admit: 2017-03-21 | Discharge: 2017-03-21 | Disposition: A | Discharge status: Home / Self Care | Payer: Medicare Other | Attending: Emergency Medicine | Admitting: Emergency Medicine

## 2017-03-21 ENCOUNTER — Emergency Department: Payer: Medicare Other

## 2017-03-21 ENCOUNTER — Other Ambulatory Visit: Payer: Self-pay

## 2017-03-21 DIAGNOSIS — M79641 Pain in right hand: Secondary | ICD-10-CM

## 2017-03-21 DIAGNOSIS — Z96641 Presence of right artificial hip joint: Secondary | ICD-10-CM | POA: Diagnosis not present

## 2017-03-21 DIAGNOSIS — Y929 Unspecified place or not applicable: Secondary | ICD-10-CM | POA: Insufficient documentation

## 2017-03-21 DIAGNOSIS — Y939 Activity, unspecified: Secondary | ICD-10-CM | POA: Diagnosis not present

## 2017-03-21 DIAGNOSIS — W231XXA Caught, crushed, jammed, or pinched between stationary objects, initial encounter: Secondary | ICD-10-CM | POA: Insufficient documentation

## 2017-03-21 DIAGNOSIS — Y999 Unspecified external cause status: Secondary | ICD-10-CM | POA: Insufficient documentation

## 2017-03-21 DIAGNOSIS — Z79899 Other long term (current) drug therapy: Secondary | ICD-10-CM | POA: Diagnosis not present

## 2017-03-21 LAB — CBC WITH DIFFERENTIAL/PLATELET
Basophils Absolute: 0 10*3/uL (ref 0–0.1)
Basophils Relative: 0 %
EOS PCT: 0 %
Eosinophils Absolute: 0 10*3/uL (ref 0–0.7)
HCT: 40.4 % (ref 40.0–52.0)
Hemoglobin: 13.9 g/dL (ref 13.0–18.0)
LYMPHS ABS: 1.6 10*3/uL (ref 1.0–3.6)
LYMPHS PCT: 21 %
MCH: 30.9 pg (ref 26.0–34.0)
MCHC: 34.6 g/dL (ref 32.0–36.0)
MCV: 89.4 fL (ref 80.0–100.0)
Monocytes Absolute: 0.7 10*3/uL (ref 0.2–1.0)
Monocytes Relative: 9 %
Neutro Abs: 5.6 10*3/uL (ref 1.4–6.5)
Neutrophils Relative %: 70 %
PLATELETS: 196 10*3/uL (ref 150–440)
RBC: 4.51 MIL/uL (ref 4.40–5.90)
RDW: 17.7 % — ABNORMAL HIGH (ref 11.5–14.5)
WBC: 7.9 10*3/uL (ref 3.8–10.6)

## 2017-03-21 LAB — BASIC METABOLIC PANEL
Anion gap: 11 (ref 5–15)
BUN: 19 mg/dL (ref 6–20)
CHLORIDE: 107 mmol/L (ref 101–111)
CO2: 24 mmol/L (ref 22–32)
Calcium: 8.2 mg/dL — ABNORMAL LOW (ref 8.9–10.3)
Creatinine, Ser: 0.72 mg/dL (ref 0.61–1.24)
GFR calc Af Amer: >60 mL/min (ref >60)
GLUCOSE: 122 mg/dL — AB (ref 65–99)
POTASSIUM: 2.9 mmol/L — AB (ref 3.5–5.1)
Sodium: 142 mmol/L (ref 135–145)

## 2017-03-21 LAB — CK: Total CK: 246 U/L (ref 49–397)

## 2017-03-21 LAB — LACTIC ACID, PLASMA: Lactic Acid, Venous: 1.4 mmol/L (ref 0.5–1.9)

## 2017-03-21 MED ORDER — LIDOCAINE-EPINEPHRINE-TETRACAINE (LET) SOLUTION
3.0000 mL | Freq: Once | NASAL | Status: AC
  Administered 2017-03-21: 3 mL via TOPICAL
  Filled 2017-03-21: qty 3

## 2017-03-21 MED ORDER — POTASSIUM CHLORIDE CRYS ER 20 MEQ PO TBCR
40.0000 meq | EXTENDED_RELEASE_TABLET | Freq: Once | ORAL | Status: AC
  Administered 2017-03-21: 40 meq via ORAL

## 2017-03-21 MED ORDER — ACETAMINOPHEN 500 MG PO TABS
1000.0000 mg | ORAL_TABLET | Freq: Once | ORAL | Status: AC
  Administered 2017-03-21: 1000 mg via ORAL
  Filled 2017-03-21: qty 2

## 2017-03-21 MED ORDER — POTASSIUM CHLORIDE CRYS ER 20 MEQ PO TBCR
EXTENDED_RELEASE_TABLET | ORAL | Status: AC
  Filled 2017-03-21: qty 2

## 2017-03-21 NOTE — ED Notes (Signed)
Pt verbalizes understanding of discharge instructions.

## 2017-03-21 NOTE — Discharge Instructions (Signed)
Please seek medical attention for any high fevers, chest pain, shortness of breath, change in behavior, persistent vomiting, bloody stool or any other new or concerning symptoms.  

## 2017-03-21 NOTE — ED Notes (Signed)
Pt tearful upon assessment reports severe pain to  Right hand, swelling increased since arriving to ER.  Pt reports having crush injury from hitch falling on hand. Pt reports had episode in the past with compartment syndrome reports feeling the same

## 2017-03-21 NOTE — ED Notes (Signed)
X-ray at bedside

## 2017-03-21 NOTE — ED Notes (Signed)
Dr. Derrill KayGoodman at bedside discussing plan of treatment with pt

## 2017-03-21 NOTE — ED Notes (Signed)
Pt reports his potassium levels are always low. Pt refused volar splint reports he already has one splint from yesterday at home Dr. Derrill KayGoodman made aware

## 2017-03-21 NOTE — ED Notes (Signed)
Pt reports LET has not helped with pain to right hand, requested discharge papers, RN informed pt will make MD aware

## 2017-03-21 NOTE — ED Notes (Signed)
Pt reports he has not taken any of his pain medication today reports he returned all his medications to pharmacy.
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
## 2017-03-21 NOTE — ED Triage Notes (Signed)
Pt presents via POV c/o right hand pain. Pt reports having crush injury from hitch falling on hand. Pt instructed to return if worsening. Pt has hx on compartment syndrome per pt report. Pt states hand is significantly more swollen, cold, and painful since presenting to ED. Pt tearful at this time.

## 2017-03-21 NOTE — ED Notes (Signed)
Pt reports he has a niece and nephew and he returned his unused medications to drop box

## 2017-03-21 NOTE — ED Notes (Signed)
Per Dr. Derrill KayGoodman not to repeat lactid acid

## 2017-06-09 DEATH — deceased

## 2019-02-10 IMAGING — CR DG CHEST 1V
1 series · 1 of 1 positions shown · non-contrast
Comparison: None.

CLINICAL DATA: Inspiratory shoulder pain

EXAM:
CHEST 1 VIEW

[dg chest 1 view]
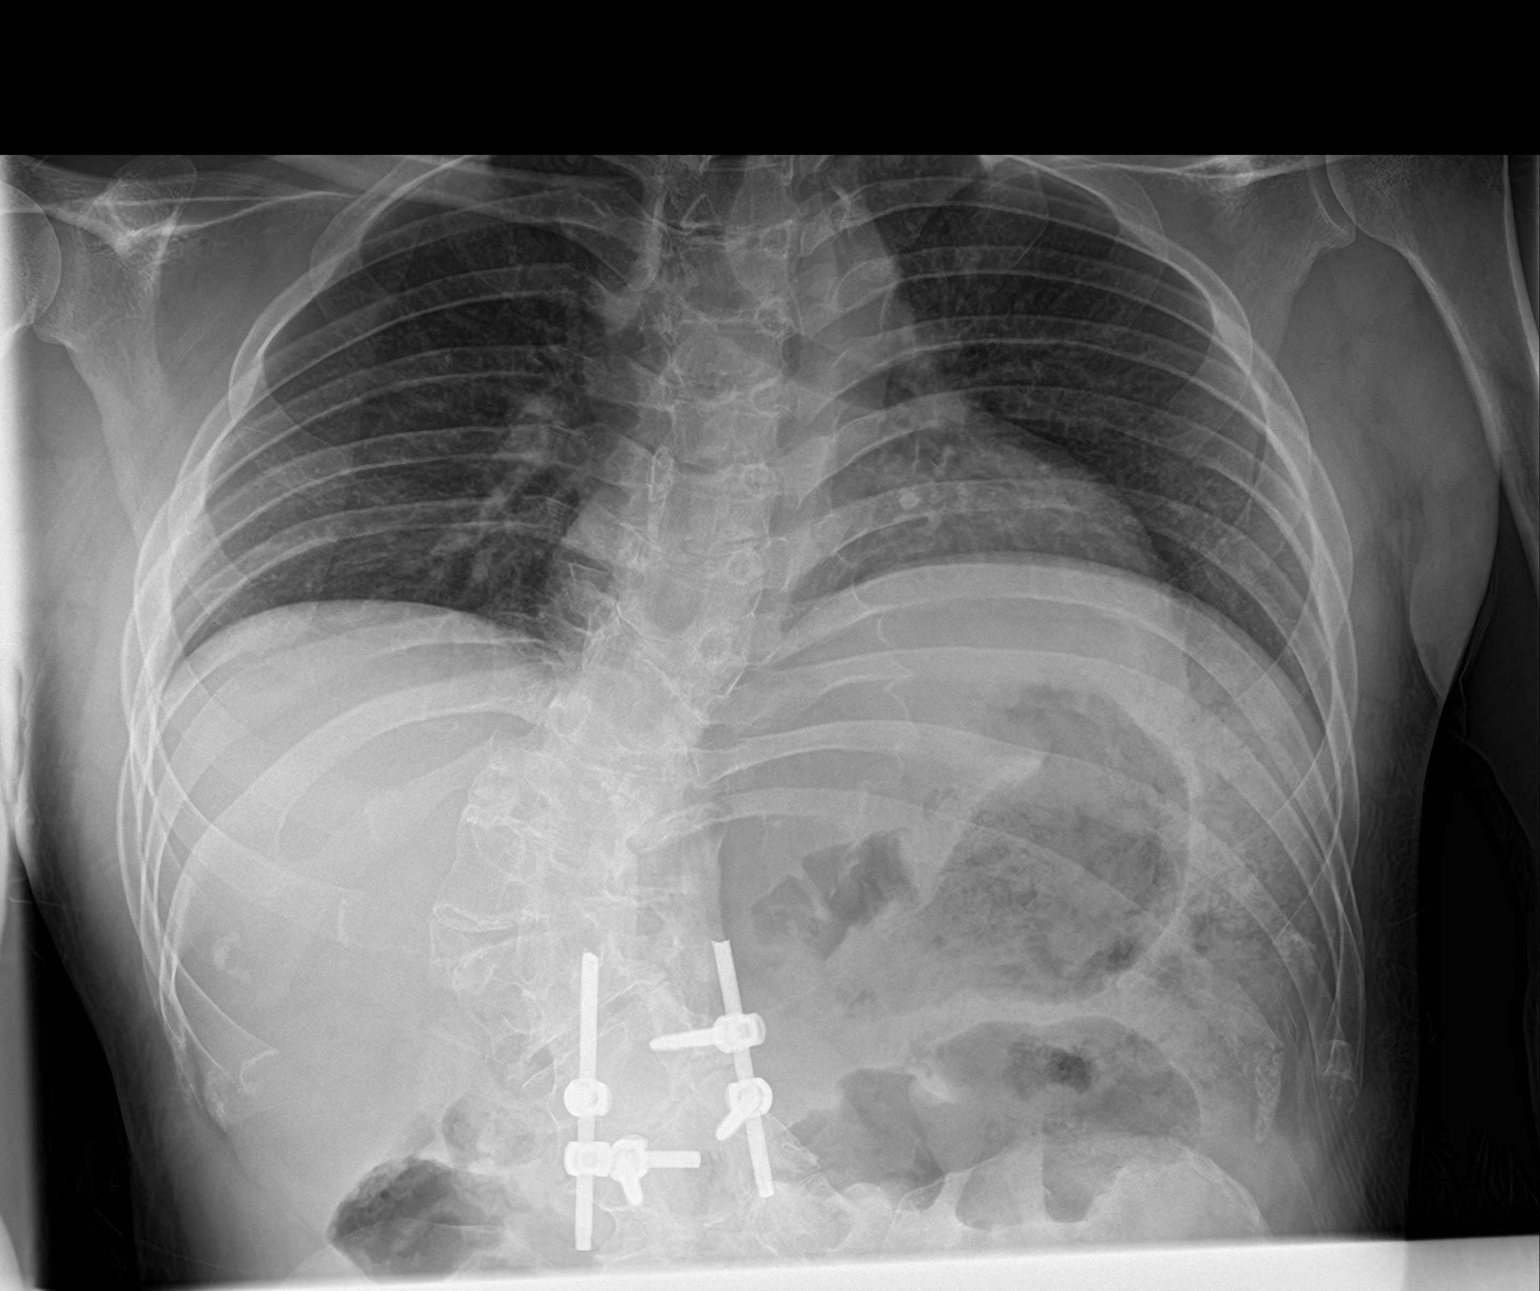

[1 of 1 positions shown; findings below may reference images not displayed]

FINDINGS: Shallow lung inflation. No focal airspace consolidation or pulmonary
edema. No rib fracture. No pleural effusion or pneumothorax. There
is marked dextroscoliosis of the thoracolumbar spine.
IMPRESSION: No active disease.

## 2019-02-10 IMAGING — CR DG CERVICAL SPINE 2 OR 3 VIEWS
1 series · 5 of 5 positions shown · non-contrast
Comparison: None.

CLINICAL DATA: Fall from wheelchair on [REDACTED]. Neck and shoulder
pain that beginning over the past few days. Initial encounter.

EXAM:
CERVICAL SPINE - 2-3 VIEW

[Series 1: dg cervical spine 2 or 3 views · 0.14mm/px · 5 of 5 slices shown]
[im 1/5]
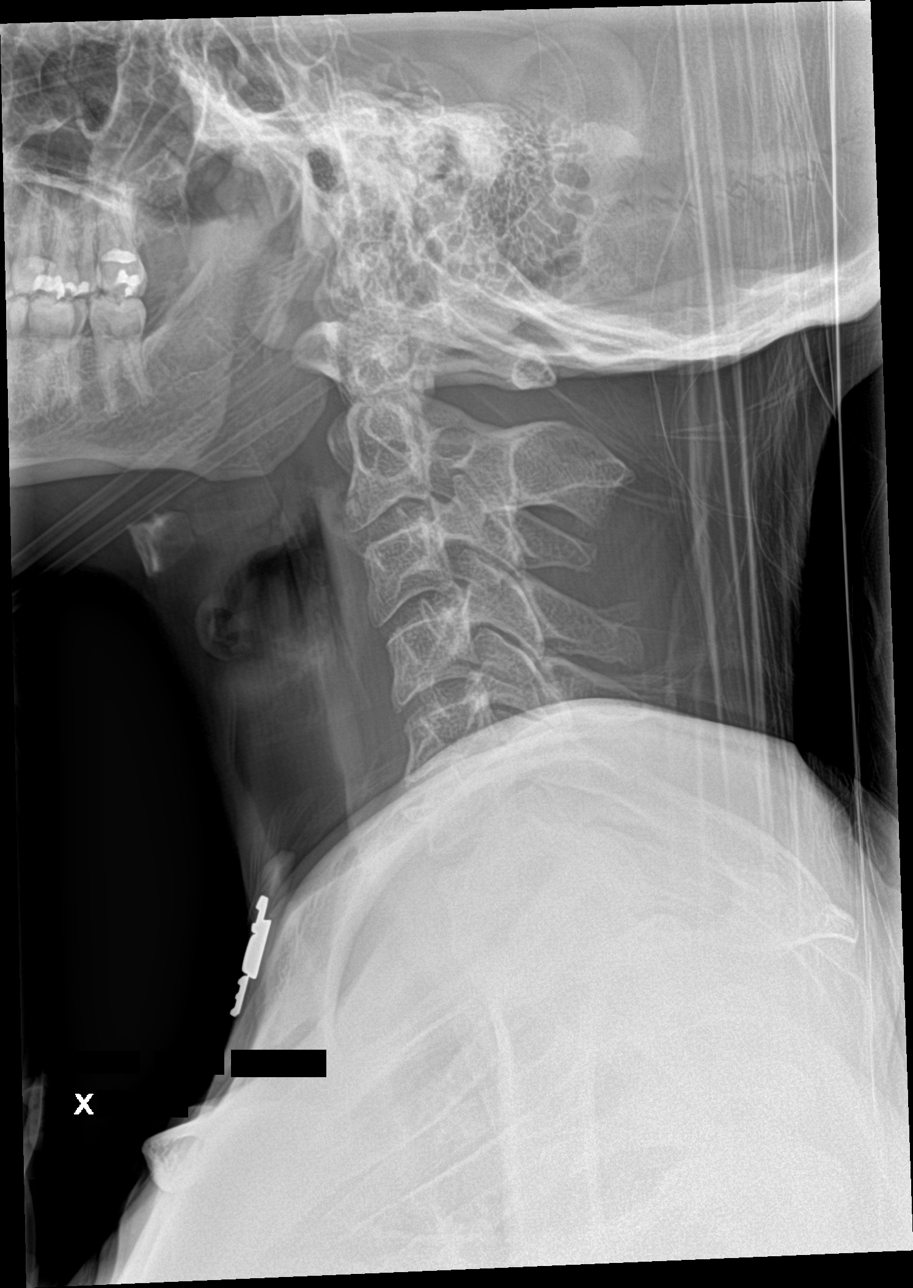
[im 2/5]
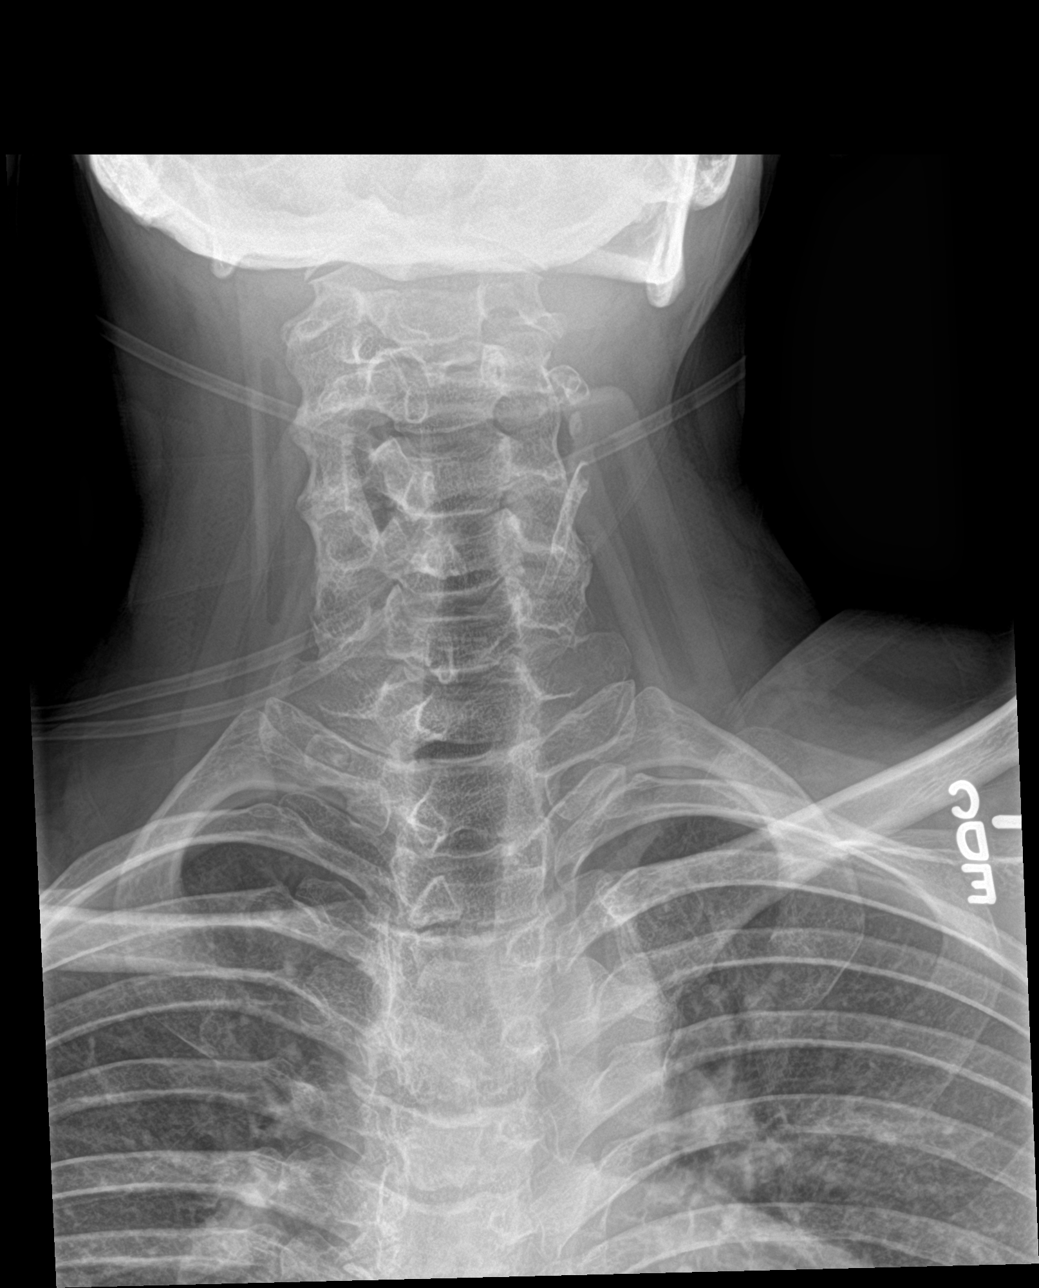
[im 3/5]
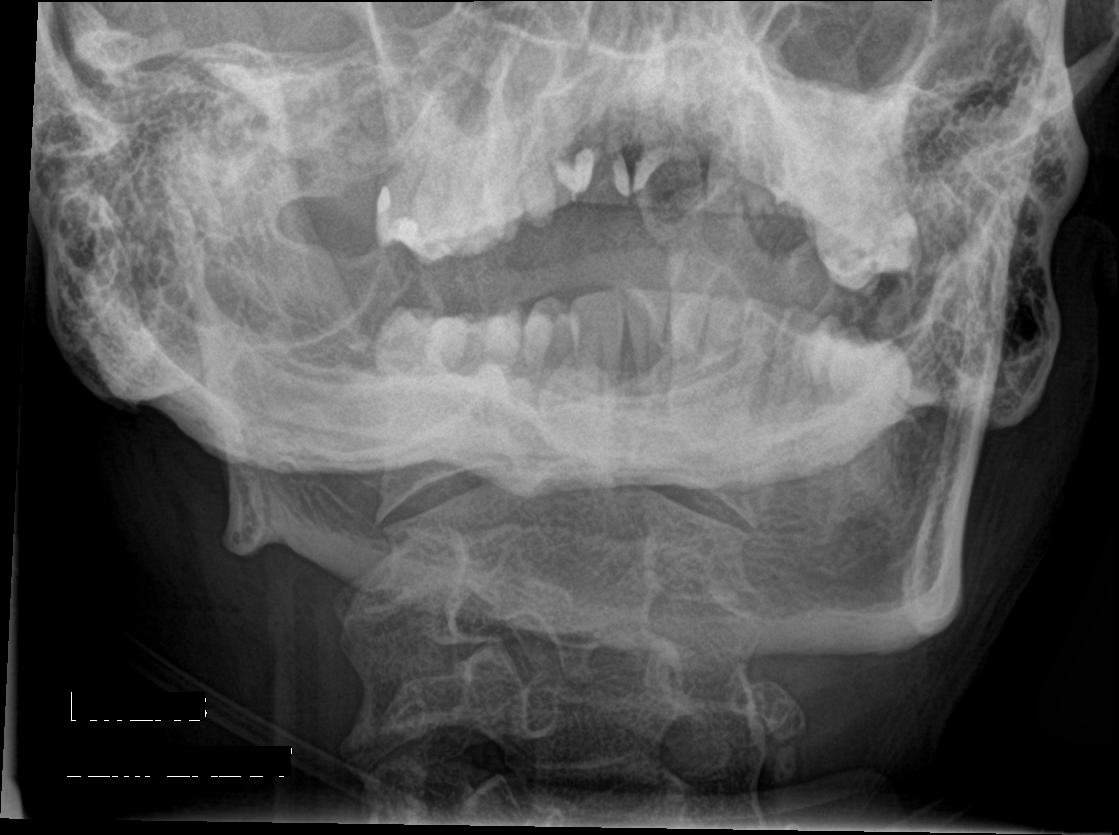
[im 4/5]
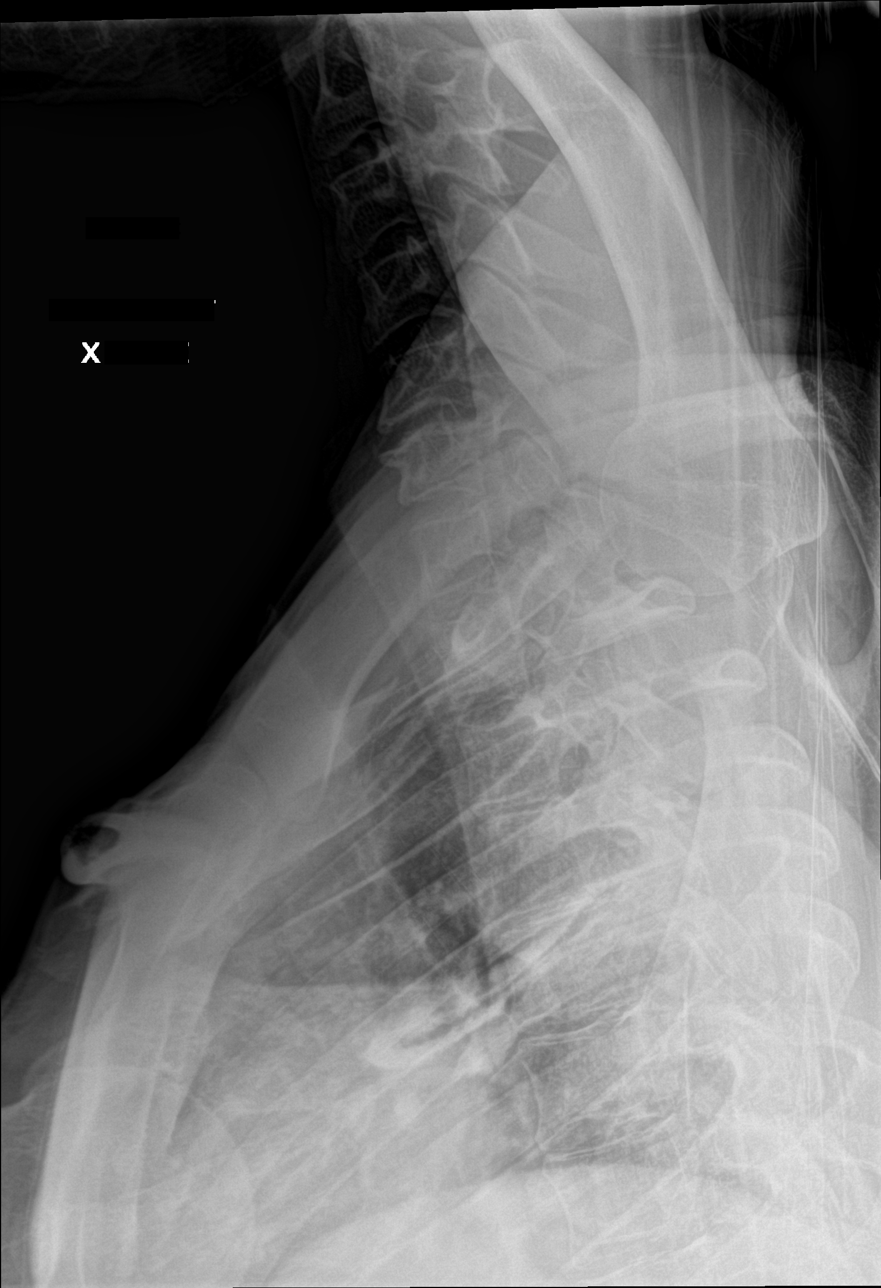
[im 5/5]
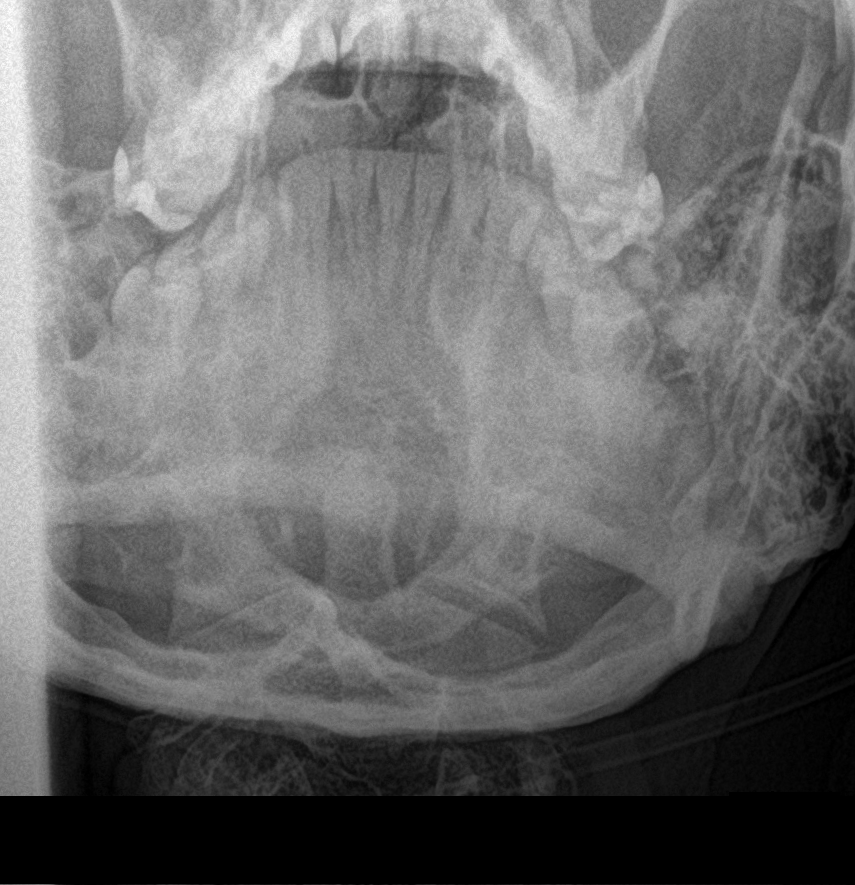

[5 of 5 positions shown; findings below may reference images not displayed]

FINDINGS: No evidence of acute fracture. Mild C6 body height loss and ridging
which could be degenerative or from remote trauma. Mild C5-6 disc
narrowing. No listhesis or prevertebral soft tissue thickening

The apical lungs are clear.
IMPRESSION: No acute finding.

## 2019-02-10 IMAGING — CR DG SHOULDER 2+V*R*
1 series · 3 of 3 positions shown · non-contrast
Comparison: None.

CLINICAL DATA: Fall with shoulder pain.

EXAM:
RIGHT SHOULDER - 2+ VIEW

[Series 1: dg shoulder right · 0.14mm/px · 3 of 3 slices shown]
[im 1/3]
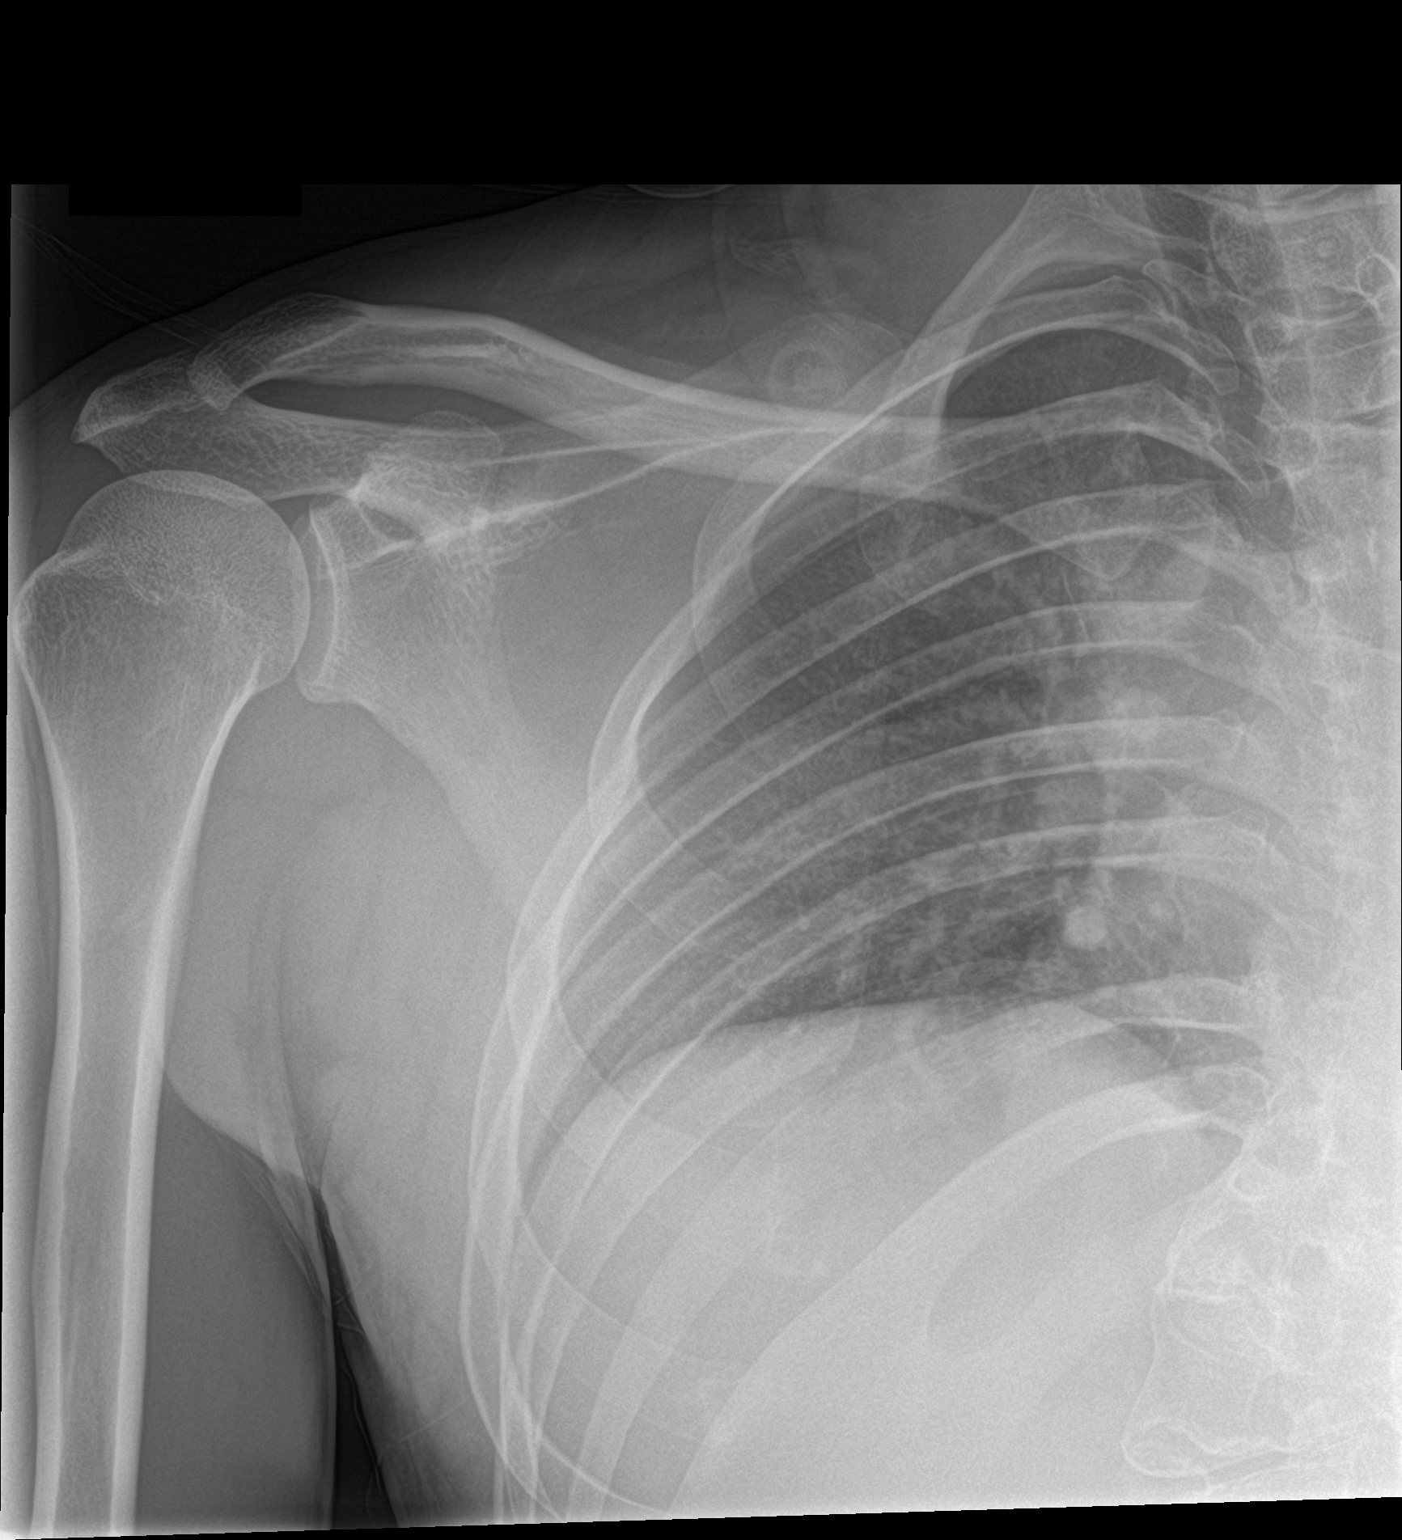
[im 2/3]
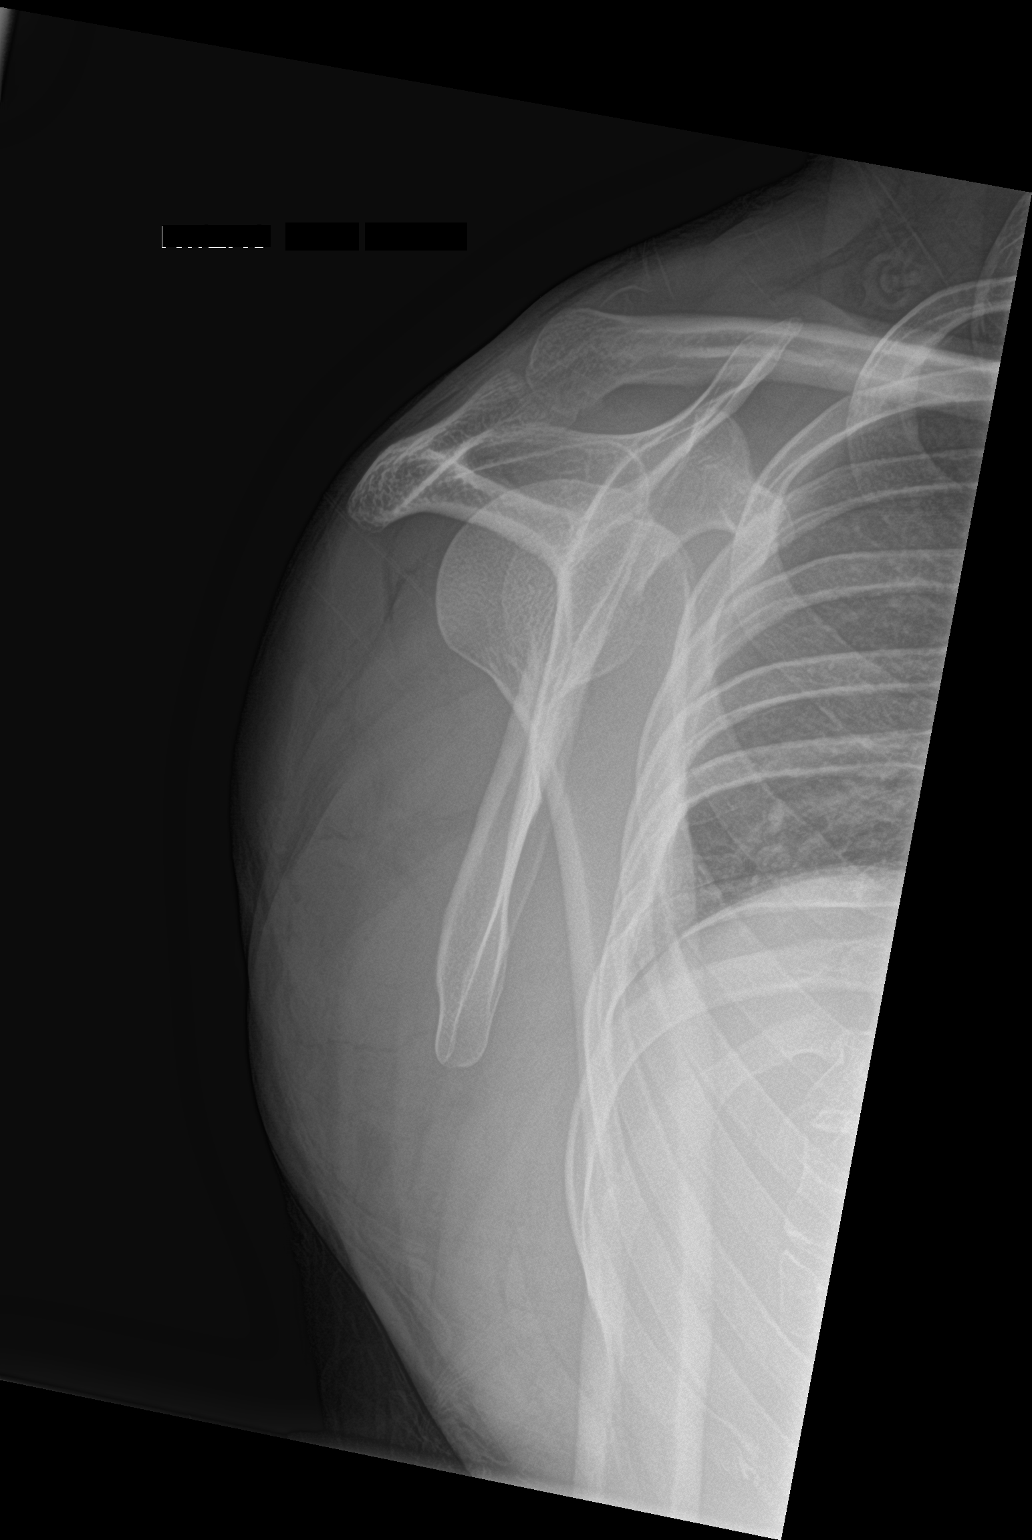
[im 3/3]
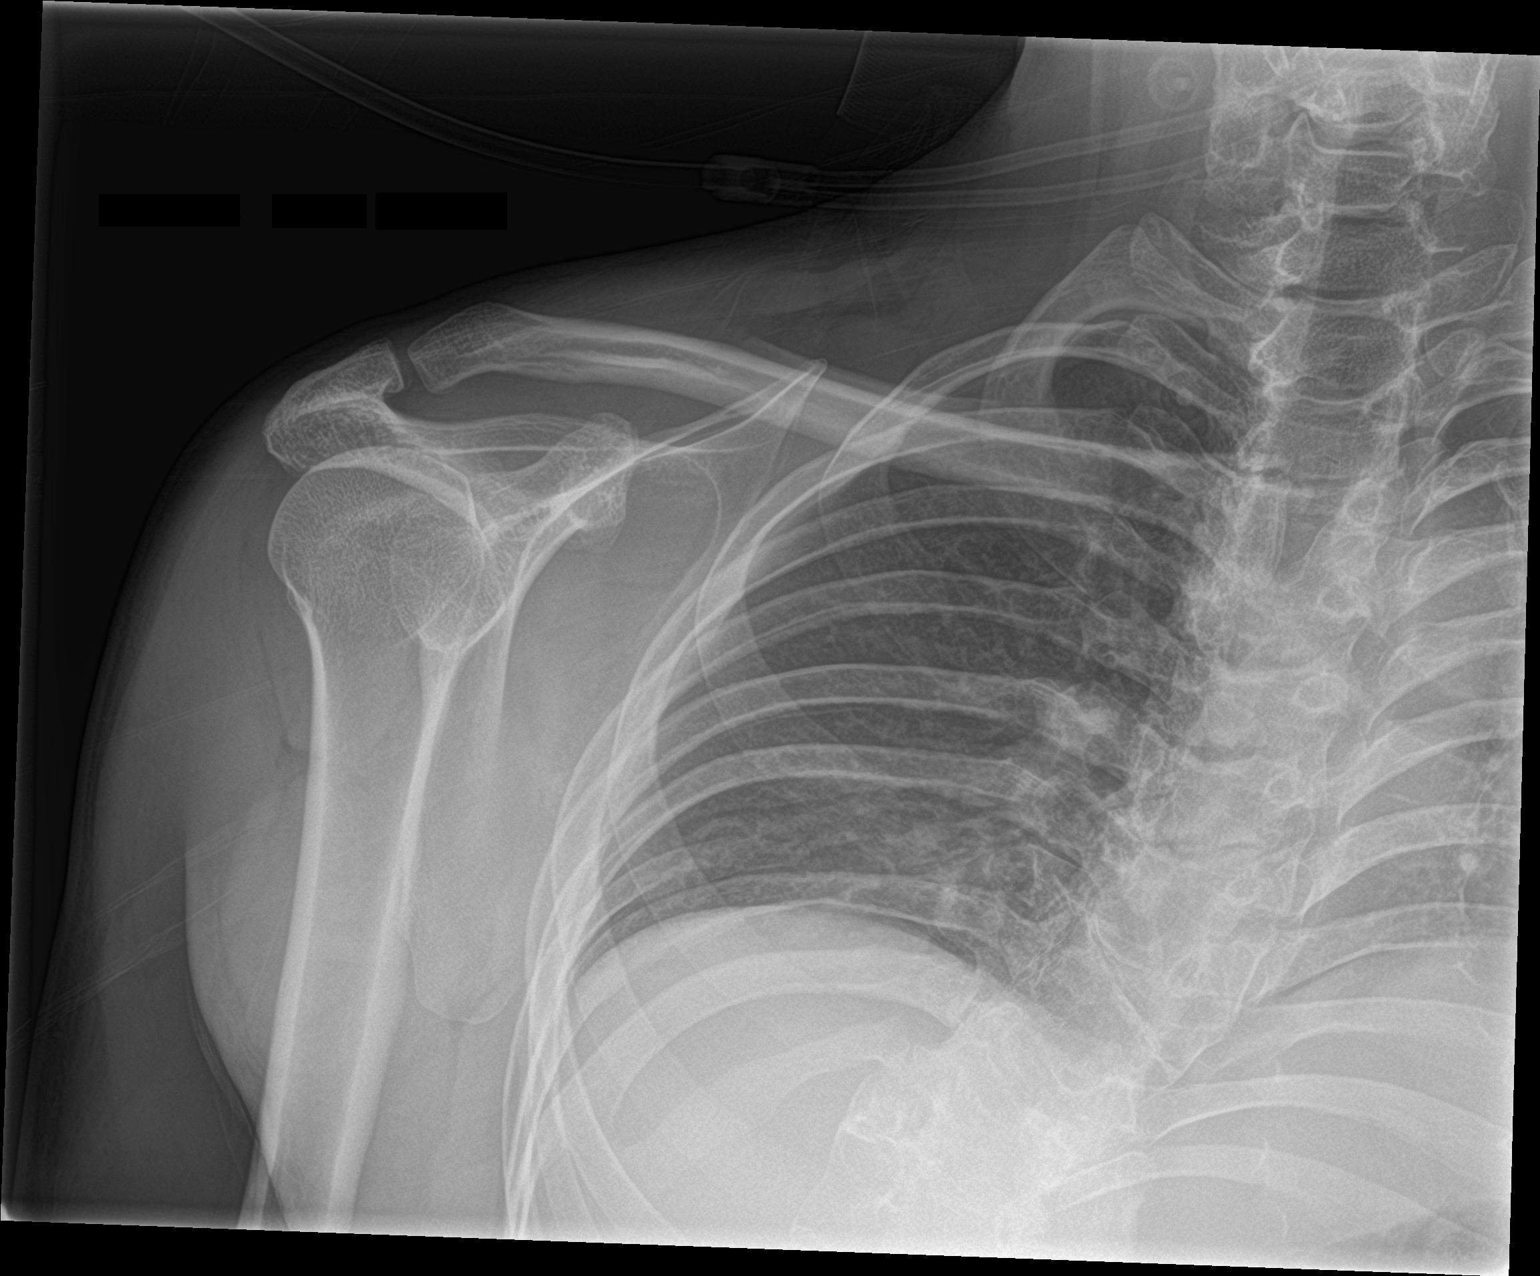

[3 of 3 positions shown; findings below may reference images not displayed]

FINDINGS: There is no evidence of fracture or dislocation. There is no
evidence of arthropathy or other focal bone abnormality. Soft
tissues are unremarkable.
IMPRESSION: Negative.

## 2019-05-28 IMAGING — CT CT ABD-PELV W/ CM
2 of 4 series · 15 of 46 positions shown, 17 images · IV contrast (APPLIED)
Comparison: February 12, 2016

CLINICAL DATA: Lethargy and hematuria.  Previous trauma 2 urethra

EXAM:
CT ABDOMEN AND PELVIS WITH CONTRAST
TECHNIQUE: Multidetector CT imaging of the abdomen and pelvis was performed
using the standard protocol following bolus administration of
intravenous contrast.
CONTRAST:  100mL 18VWLA-QHH IOPAMIDOL (18VWLA-QHH) INJECTION 61%

[Series 2: axial st · axial · 0.67mm/px · z∈[-995,-655]mm · 12 of 79 slices shown, 14 images]
[im 7/79  soft-tissue]
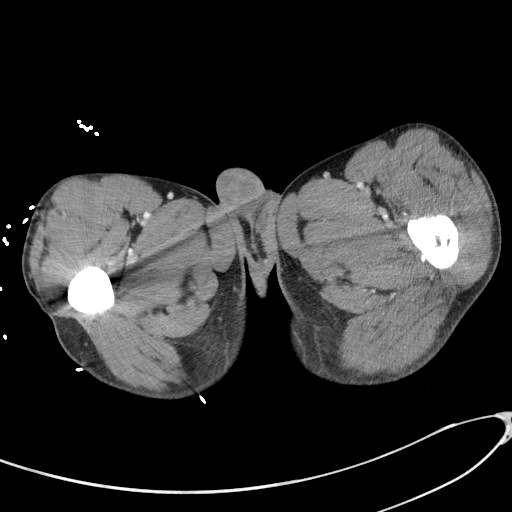
[im 7/79  bone]
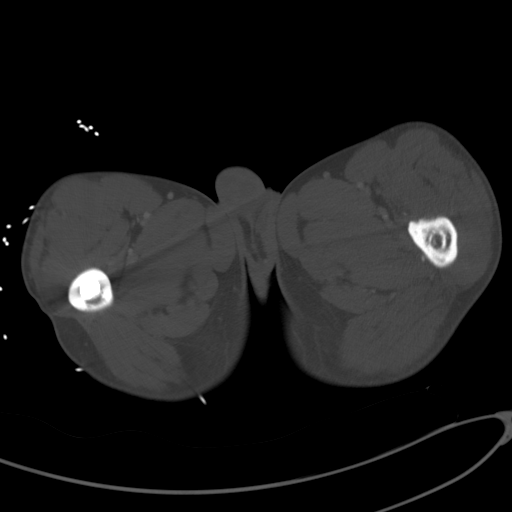
[im 13/79  soft-tissue]
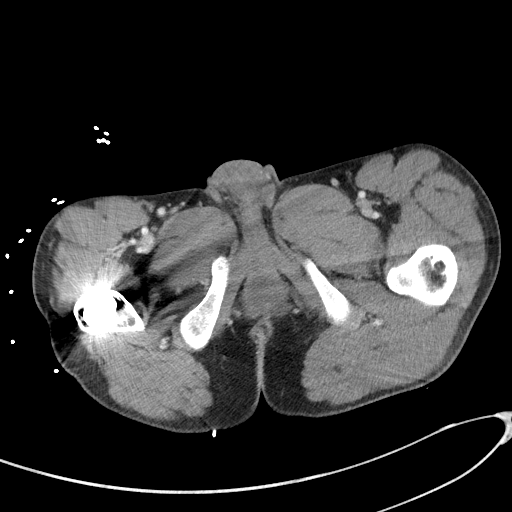
[im 19/79  soft-tissue]
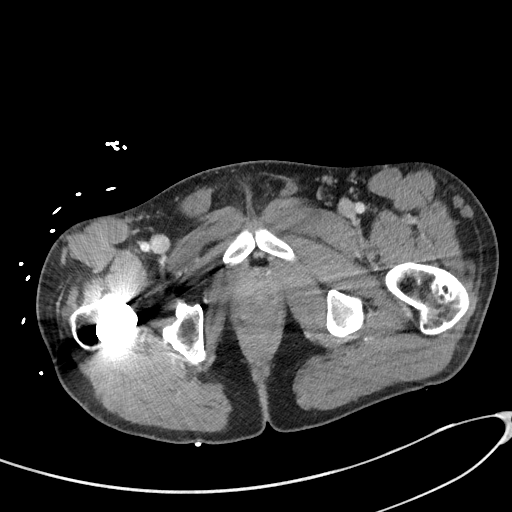
[im 25/79  soft-tissue]
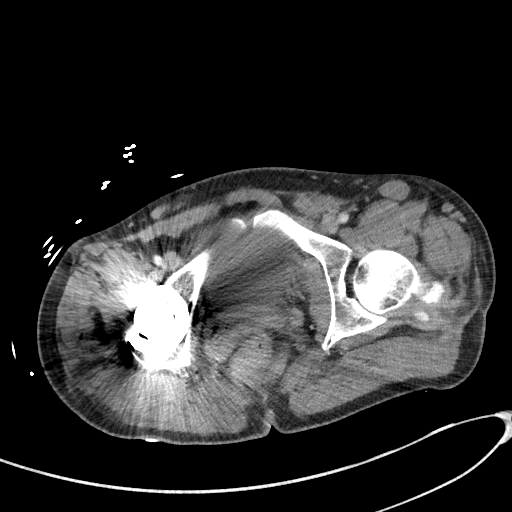
[im 32/79  soft-tissue]
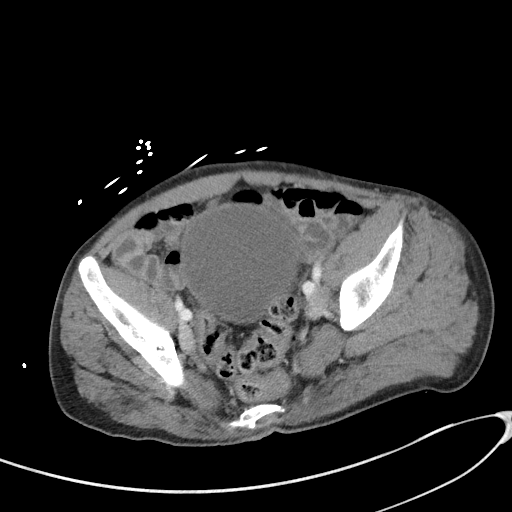
[im 38/79  soft-tissue]
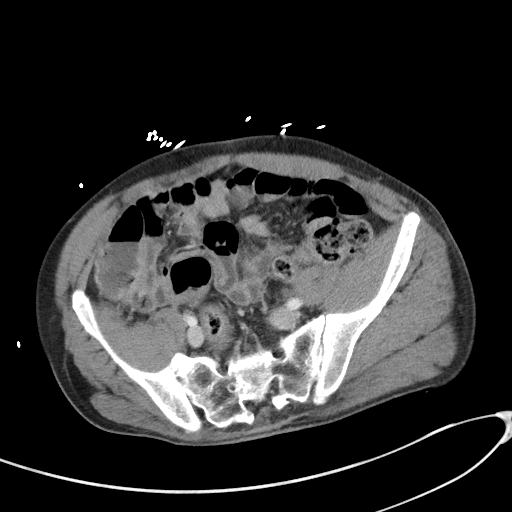
[im 44/79  soft-tissue]
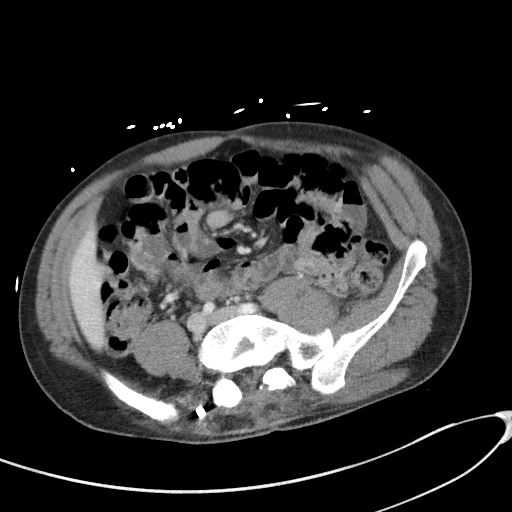
[im 50/79  soft-tissue]
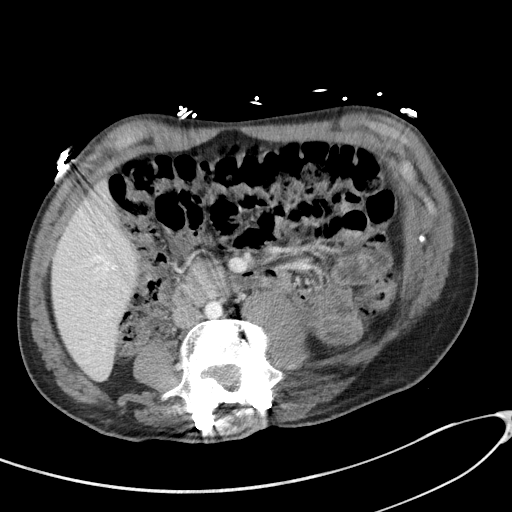
[im 57/79  soft-tissue]
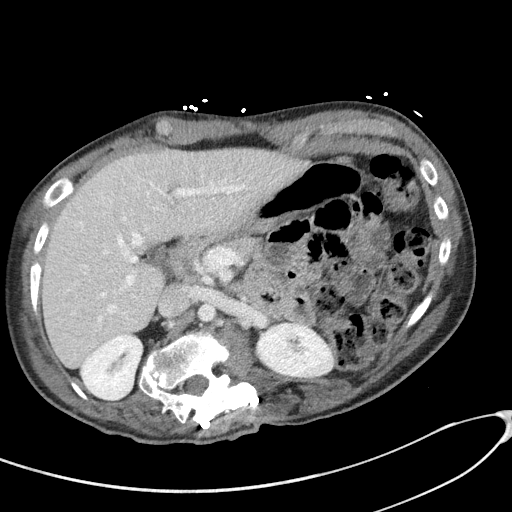
[im 57/79  bone]
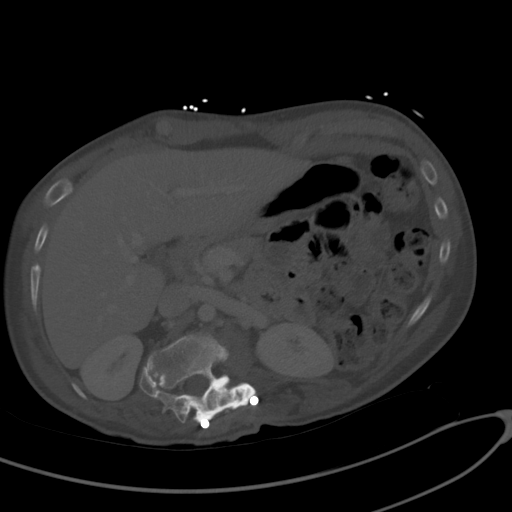
[im 63/79  soft-tissue]
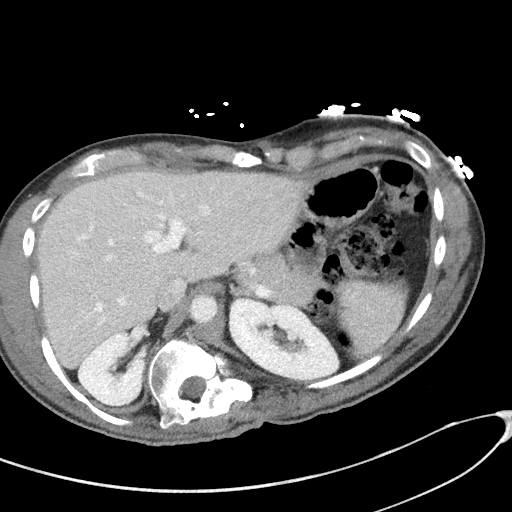
[im 69/79  soft-tissue]
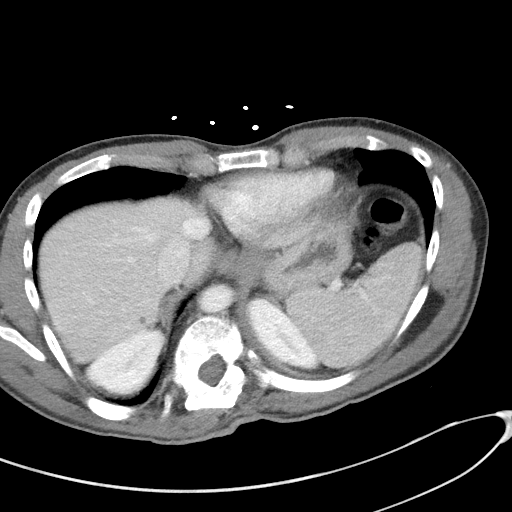
[im 75/79  soft-tissue]
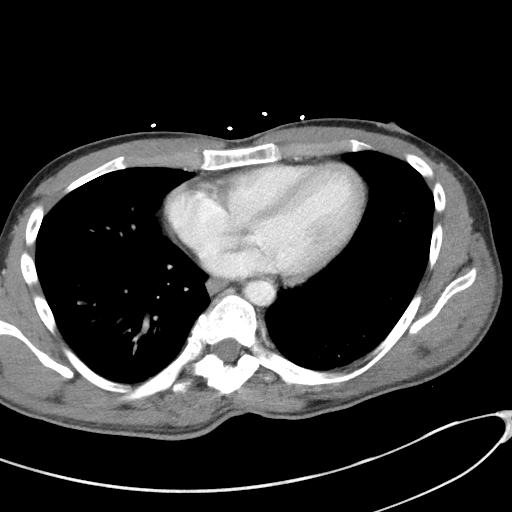

[Series 5: coronal st · coronal · 0.67mm/px · 3 of 78 slices shown]
[im 26/78  soft-tissue]
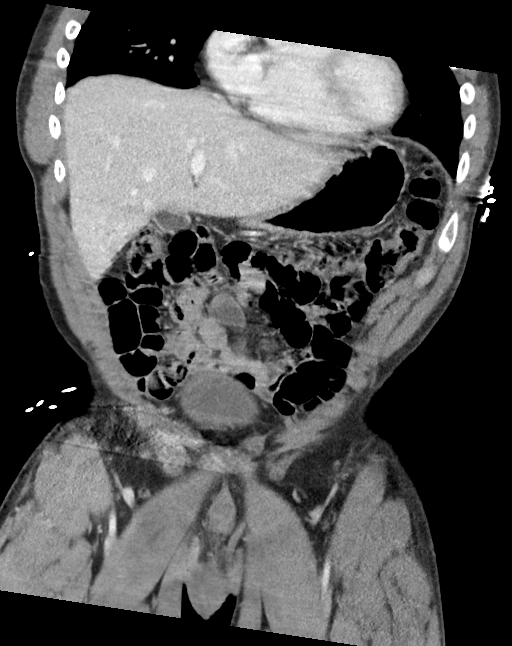
[im 35/78  soft-tissue]
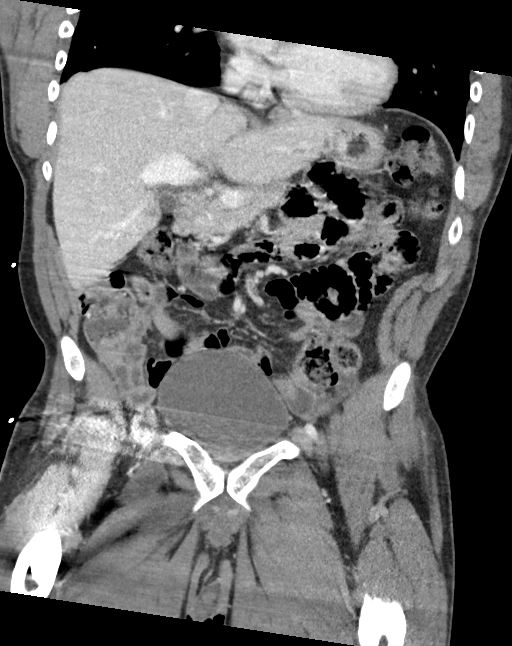
[im 43/78  soft-tissue]
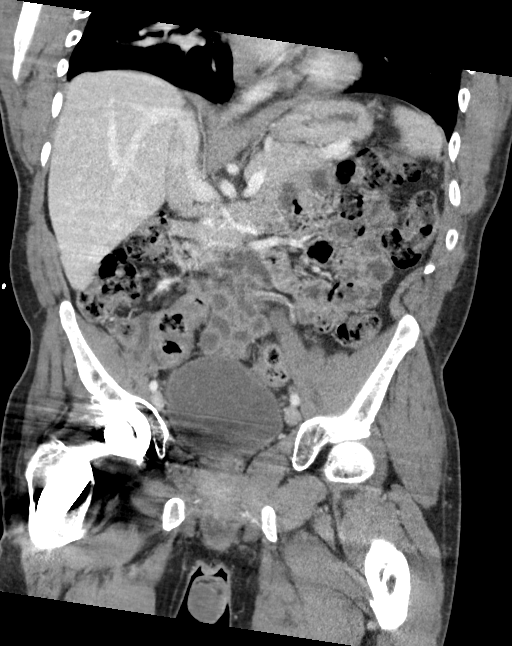

[15 of 46 positions shown; findings below may reference images not displayed]

FINDINGS: Lower chest: Lung bases are clear.

Hepatobiliary: No focal liver lesions are appreciable. Gallbladder
wall is not appreciably thickened. There is no biliary duct
dilatation.

Pancreas: No pancreatic mass or inflammatory focus.

Spleen: No splenic lesions are evident.

Adrenals/Urinary Tract: Adrenals appear normal bilaterally. Kidneys
bilaterally show no evident mass or hydronephrosis on either side.
There is no renal or ureteral calculus on either side. Urinary
bladder is midline with wall thickness within normal limits. There
is no perivesical fluid or inflammatory change.

Stomach/Bowel: There is no appreciable bowel wall or mesenteric
thickening. There is no evident bowel obstruction. No free air or
portal venous air appreciable.

Vascular/Lymphatic: There is no abdominal aortic aneurysm. No
vascular lesions are evident. There is no appreciable adenopathy in
the abdomen or pelvis.

Reproductive: Prostate and seminal vesicles appear normal in size
and contour. There is no evident pelvic mass. There is again noted a
slightly ovoid structure in the right inguinal ring area, stable
from prior study. This structure measures 2.5 x 1.8 cm. Suspect
undescended testis in this area. Appearance stable compared to
previous study. No inflammatory changes noted along the visualized
course of the penis.

Other: Appendix absent. No periappendiceal region inflammation. No
abscess or ascites evident in the abdomen or pelvis.

Musculoskeletal: There is total hip replacement on the right. There
is evidence of a benign-appearing lesion in the left subtrochanteric
region measuring 2.5 x 1.5 cm, stable. There is marked
dextroscoliosis in the upper lumbar region with a hemivertebra.
There is postoperative screw fixation in the lower lumbar region. No
blastic or lytic bone lesions are evident. There is no intramuscular
or abdominal wall lesion.
IMPRESSION: 1. Suspect nondistended testis in the right inguinal canal at the
level of the pubic symphysis. This finding is stable compared to the
previous study.

2. No renal or ureteral calculus. No hydronephrosis. No inflammation
involving the urinary bladder or visualized portions of the penis.

3. Evidence of scoliosis with hemivertebra. Areas of postoperative
change noted. Total hip replaced on the right. Benign-appearing
lesion and proximal femur on the left, stable.

4. No bowel obstruction or bowel wall thickening. No abscess.
Appendix absent. No periappendiceal region inflammation.

## 2023-07-22 NOTE — Progress Notes (Signed)
 error
